# Patient Record
Sex: Female | Born: 1955 | Race: Black or African American | Hispanic: No | Marital: Single | Smoking: Never smoker
Health system: Southern US, Community
[De-identification: ages and names within clinical notes are randomized; demographics above are authoritative.]

## PROBLEM LIST (undated history)

## (undated) DIAGNOSIS — G43909 Migraine, unspecified, not intractable, without status migrainosus: Secondary | ICD-10-CM

## (undated) DIAGNOSIS — N19 Unspecified kidney failure: Secondary | ICD-10-CM

## (undated) DIAGNOSIS — E785 Hyperlipidemia, unspecified: Secondary | ICD-10-CM

## (undated) DIAGNOSIS — Z5189 Encounter for other specified aftercare: Secondary | ICD-10-CM

## (undated) DIAGNOSIS — I499 Cardiac arrhythmia, unspecified: Secondary | ICD-10-CM

## (undated) DIAGNOSIS — T8859XA Other complications of anesthesia, initial encounter: Secondary | ICD-10-CM

## (undated) DIAGNOSIS — E041 Nontoxic single thyroid nodule: Secondary | ICD-10-CM

## (undated) DIAGNOSIS — D649 Anemia, unspecified: Secondary | ICD-10-CM

## (undated) DIAGNOSIS — I10 Essential (primary) hypertension: Secondary | ICD-10-CM

## (undated) DIAGNOSIS — R112 Nausea with vomiting, unspecified: Secondary | ICD-10-CM

## (undated) DIAGNOSIS — M949 Disorder of cartilage, unspecified: Secondary | ICD-10-CM

## (undated) DIAGNOSIS — Z9889 Other specified postprocedural states: Secondary | ICD-10-CM

## (undated) DIAGNOSIS — M810 Age-related osteoporosis without current pathological fracture: Secondary | ICD-10-CM

## (undated) DIAGNOSIS — K219 Gastro-esophageal reflux disease without esophagitis: Secondary | ICD-10-CM

## (undated) DIAGNOSIS — T4145XA Adverse effect of unspecified anesthetic, initial encounter: Secondary | ICD-10-CM

## (undated) DIAGNOSIS — M899 Disorder of bone, unspecified: Secondary | ICD-10-CM

## (undated) DIAGNOSIS — Z8719 Personal history of other diseases of the digestive system: Secondary | ICD-10-CM

## (undated) DIAGNOSIS — T7840XA Allergy, unspecified, initial encounter: Secondary | ICD-10-CM

## (undated) DIAGNOSIS — M1990 Unspecified osteoarthritis, unspecified site: Secondary | ICD-10-CM

## (undated) DIAGNOSIS — R011 Cardiac murmur, unspecified: Secondary | ICD-10-CM

## (undated) HISTORY — DX: Encounter for other specified aftercare: Z51.89

## (undated) HISTORY — PX: APPENDECTOMY: SHX54

## (undated) HISTORY — PX: LAPAROSCOPIC ENDOMETRIOSIS FULGURATION: SUR769

## (undated) HISTORY — DX: Disorder of cartilage, unspecified: M94.9

## (undated) HISTORY — DX: Age-related osteoporosis without current pathological fracture: M81.0

## (undated) HISTORY — PX: FRACTURE SURGERY: SHX138

## (undated) HISTORY — DX: Disorder of bone, unspecified: M89.9

## (undated) HISTORY — DX: Anemia, unspecified: D64.9

## (undated) HISTORY — DX: Unspecified osteoarthritis, unspecified site: M19.90

## (undated) HISTORY — DX: Cardiac murmur, unspecified: R01.1

## (undated) HISTORY — PX: FINGER FRACTURE SURGERY: SHX638

## (undated) HISTORY — PX: COLONOSCOPY: SHX174

## (undated) HISTORY — DX: Allergy, unspecified, initial encounter: T78.40XA

## (undated) HISTORY — DX: Unspecified kidney failure: N19

## (undated) HISTORY — DX: Hyperlipidemia, unspecified: E78.5

## (undated) HISTORY — DX: Essential (primary) hypertension: I10

## (undated) HISTORY — PX: NOSE SURGERY: SHX723

## (undated) HISTORY — DX: Migraine, unspecified, not intractable, without status migrainosus: G43.909

---

## 1997-11-06 ENCOUNTER — Ambulatory Visit (HOSPITAL_COMMUNITY): Admission: RE | Admit: 1997-11-06 | Discharge: 1997-11-06 | Payer: Self-pay | Admitting: Family Medicine

## 1998-05-30 ENCOUNTER — Emergency Department (HOSPITAL_COMMUNITY): Admission: EM | Admit: 1998-05-30 | Discharge: 1998-05-30 | Payer: Self-pay | Admitting: Emergency Medicine

## 1998-05-31 ENCOUNTER — Encounter: Payer: Self-pay | Admitting: Emergency Medicine

## 1998-07-23 ENCOUNTER — Other Ambulatory Visit: Admission: RE | Admit: 1998-07-23 | Discharge: 1998-07-23 | Payer: Self-pay | Admitting: Family Medicine

## 1998-09-16 ENCOUNTER — Emergency Department (HOSPITAL_COMMUNITY): Admission: EM | Admit: 1998-09-16 | Discharge: 1998-09-16 | Payer: Self-pay | Admitting: Emergency Medicine

## 1998-10-17 ENCOUNTER — Encounter: Admission: RE | Admit: 1998-10-17 | Discharge: 1999-01-15 | Payer: Self-pay | Admitting: Orthopedic Surgery

## 1999-10-01 ENCOUNTER — Other Ambulatory Visit: Admission: RE | Admit: 1999-10-01 | Discharge: 1999-10-01 | Payer: Self-pay | Admitting: Family Medicine

## 2000-07-13 ENCOUNTER — Encounter: Payer: Self-pay | Admitting: Family Medicine

## 2000-07-13 ENCOUNTER — Ambulatory Visit (HOSPITAL_COMMUNITY): Admission: RE | Admit: 2000-07-13 | Discharge: 2000-07-13 | Payer: Self-pay | Admitting: Family Medicine

## 2001-07-20 ENCOUNTER — Encounter: Payer: Self-pay | Admitting: Internal Medicine

## 2001-07-20 ENCOUNTER — Ambulatory Visit (HOSPITAL_COMMUNITY): Admission: RE | Admit: 2001-07-20 | Discharge: 2001-07-20 | Payer: Self-pay | Admitting: Internal Medicine

## 2001-11-02 ENCOUNTER — Other Ambulatory Visit: Admission: RE | Admit: 2001-11-02 | Discharge: 2001-11-02 | Payer: Self-pay | Admitting: Obstetrics and Gynecology

## 2002-04-07 ENCOUNTER — Ambulatory Visit (HOSPITAL_BASED_OUTPATIENT_CLINIC_OR_DEPARTMENT_OTHER): Admission: RE | Admit: 2002-04-07 | Discharge: 2002-04-07 | Payer: Self-pay | Admitting: Orthopedic Surgery

## 2003-03-08 ENCOUNTER — Ambulatory Visit (HOSPITAL_COMMUNITY): Admission: RE | Admit: 2003-03-08 | Discharge: 2003-03-08 | Payer: Self-pay | Admitting: Family Medicine

## 2003-03-08 ENCOUNTER — Encounter: Admission: RE | Admit: 2003-03-08 | Discharge: 2003-03-08 | Payer: Self-pay | Admitting: Family Medicine

## 2003-04-25 ENCOUNTER — Encounter: Admission: RE | Admit: 2003-04-25 | Discharge: 2003-04-25 | Payer: Self-pay | Admitting: Sports Medicine

## 2003-05-12 ENCOUNTER — Ambulatory Visit (HOSPITAL_COMMUNITY): Admission: RE | Admit: 2003-05-12 | Discharge: 2003-05-12 | Payer: Self-pay | Admitting: Vascular Surgery

## 2003-05-29 ENCOUNTER — Encounter: Admission: RE | Admit: 2003-05-29 | Discharge: 2003-05-29 | Payer: Self-pay | Admitting: Sports Medicine

## 2003-06-05 ENCOUNTER — Encounter: Admission: RE | Admit: 2003-06-05 | Discharge: 2003-06-05 | Payer: Self-pay | Admitting: Family Medicine

## 2003-10-13 ENCOUNTER — Encounter: Admission: RE | Admit: 2003-10-13 | Discharge: 2003-10-13 | Payer: Self-pay | Admitting: Family Medicine

## 2003-10-27 ENCOUNTER — Encounter: Admission: RE | Admit: 2003-10-27 | Discharge: 2003-10-27 | Payer: Self-pay | Admitting: Family Medicine

## 2003-10-27 ENCOUNTER — Encounter (INDEPENDENT_AMBULATORY_CARE_PROVIDER_SITE_OTHER): Payer: Self-pay | Admitting: Specialist

## 2003-11-02 ENCOUNTER — Ambulatory Visit (HOSPITAL_COMMUNITY): Admission: RE | Admit: 2003-11-02 | Discharge: 2003-11-02 | Payer: Self-pay | Admitting: *Deleted

## 2003-11-06 ENCOUNTER — Ambulatory Visit (HOSPITAL_COMMUNITY): Admission: RE | Admit: 2003-11-06 | Discharge: 2003-11-06 | Payer: Self-pay | Admitting: Internal Medicine

## 2003-12-01 ENCOUNTER — Encounter: Admission: RE | Admit: 2003-12-01 | Discharge: 2003-12-01 | Payer: Self-pay | Admitting: Family Medicine

## 2003-12-04 ENCOUNTER — Ambulatory Visit (HOSPITAL_COMMUNITY): Admission: RE | Admit: 2003-12-04 | Discharge: 2003-12-04 | Payer: Self-pay | Admitting: Sports Medicine

## 2003-12-11 ENCOUNTER — Encounter: Admission: RE | Admit: 2003-12-11 | Discharge: 2003-12-11 | Payer: Self-pay | Admitting: Sports Medicine

## 2004-02-12 ENCOUNTER — Ambulatory Visit: Payer: Self-pay | Admitting: Family Medicine

## 2004-02-14 ENCOUNTER — Ambulatory Visit (HOSPITAL_COMMUNITY): Admission: RE | Admit: 2004-02-14 | Discharge: 2004-02-14 | Payer: Self-pay | Admitting: Family Medicine

## 2004-03-29 ENCOUNTER — Ambulatory Visit: Payer: Self-pay | Admitting: Sports Medicine

## 2004-05-03 ENCOUNTER — Ambulatory Visit: Payer: Self-pay | Admitting: Sports Medicine

## 2004-11-01 ENCOUNTER — Ambulatory Visit: Payer: Self-pay | Admitting: Family Medicine

## 2005-03-21 ENCOUNTER — Encounter (INDEPENDENT_AMBULATORY_CARE_PROVIDER_SITE_OTHER): Payer: Self-pay | Admitting: *Deleted

## 2005-03-21 LAB — CONVERTED CEMR LAB

## 2005-03-25 ENCOUNTER — Ambulatory Visit: Payer: Self-pay | Admitting: Sports Medicine

## 2005-03-26 ENCOUNTER — Ambulatory Visit: Payer: Self-pay | Admitting: Family Medicine

## 2005-04-03 ENCOUNTER — Ambulatory Visit: Payer: Self-pay | Admitting: Family Medicine

## 2005-04-16 ENCOUNTER — Ambulatory Visit (HOSPITAL_COMMUNITY): Admission: RE | Admit: 2005-04-16 | Discharge: 2005-04-16 | Payer: Self-pay | Admitting: Family Medicine

## 2005-04-23 ENCOUNTER — Emergency Department (HOSPITAL_COMMUNITY): Admission: EM | Admit: 2005-04-23 | Discharge: 2005-04-23 | Payer: Self-pay | Admitting: Emergency Medicine

## 2005-04-23 ENCOUNTER — Ambulatory Visit (HOSPITAL_COMMUNITY): Admission: RE | Admit: 2005-04-23 | Discharge: 2005-04-23 | Payer: Self-pay | Admitting: Family Medicine

## 2005-05-14 ENCOUNTER — Ambulatory Visit: Payer: Self-pay | Admitting: Family Medicine

## 2005-05-21 ENCOUNTER — Emergency Department (HOSPITAL_COMMUNITY): Admission: EM | Admit: 2005-05-21 | Discharge: 2005-05-21 | Payer: Self-pay | Admitting: Family Medicine

## 2005-07-23 ENCOUNTER — Ambulatory Visit: Payer: Self-pay | Admitting: Family Medicine

## 2005-07-25 ENCOUNTER — Ambulatory Visit: Payer: Self-pay | Admitting: Family Medicine

## 2005-09-03 ENCOUNTER — Encounter: Admission: RE | Admit: 2005-09-03 | Discharge: 2005-09-03 | Payer: Self-pay | Admitting: Nephrology

## 2005-09-25 ENCOUNTER — Ambulatory Visit (HOSPITAL_COMMUNITY): Admission: RE | Admit: 2005-09-25 | Discharge: 2005-09-25 | Payer: Self-pay | Admitting: Urology

## 2005-10-25 ENCOUNTER — Emergency Department (HOSPITAL_COMMUNITY): Admission: EM | Admit: 2005-10-25 | Discharge: 2005-10-26 | Payer: Self-pay | Admitting: Emergency Medicine

## 2005-11-28 ENCOUNTER — Ambulatory Visit: Payer: Self-pay | Admitting: Family Medicine

## 2006-01-01 ENCOUNTER — Ambulatory Visit: Payer: Self-pay | Admitting: Family Medicine

## 2006-01-02 ENCOUNTER — Ambulatory Visit (HOSPITAL_COMMUNITY): Admission: RE | Admit: 2006-01-02 | Discharge: 2006-01-02 | Payer: Self-pay | Admitting: Family Medicine

## 2006-01-08 ENCOUNTER — Ambulatory Visit: Payer: Self-pay | Admitting: Family Medicine

## 2006-06-18 DIAGNOSIS — M899 Disorder of bone, unspecified: Secondary | ICD-10-CM | POA: Insufficient documentation

## 2006-06-18 DIAGNOSIS — G43909 Migraine, unspecified, not intractable, without status migrainosus: Secondary | ICD-10-CM

## 2006-06-18 DIAGNOSIS — M810 Age-related osteoporosis without current pathological fracture: Secondary | ICD-10-CM | POA: Insufficient documentation

## 2006-06-18 DIAGNOSIS — E7800 Pure hypercholesterolemia, unspecified: Secondary | ICD-10-CM

## 2006-06-18 DIAGNOSIS — K219 Gastro-esophageal reflux disease without esophagitis: Secondary | ICD-10-CM | POA: Insufficient documentation

## 2006-06-18 DIAGNOSIS — N19 Unspecified kidney failure: Secondary | ICD-10-CM

## 2006-06-18 DIAGNOSIS — M949 Disorder of cartilage, unspecified: Secondary | ICD-10-CM

## 2006-06-18 HISTORY — DX: Unspecified kidney failure: N19

## 2006-06-18 HISTORY — DX: Disorder of bone, unspecified: M89.9

## 2006-06-18 HISTORY — DX: Migraine, unspecified, not intractable, without status migrainosus: G43.909

## 2006-06-19 ENCOUNTER — Encounter (INDEPENDENT_AMBULATORY_CARE_PROVIDER_SITE_OTHER): Payer: Self-pay | Admitting: *Deleted

## 2006-06-24 ENCOUNTER — Ambulatory Visit (HOSPITAL_COMMUNITY): Admission: RE | Admit: 2006-06-24 | Discharge: 2006-06-24 | Payer: Self-pay | Admitting: Obstetrics and Gynecology

## 2006-07-21 ENCOUNTER — Telehealth: Payer: Self-pay | Admitting: *Deleted

## 2006-07-22 ENCOUNTER — Ambulatory Visit: Payer: Self-pay | Admitting: Family Medicine

## 2006-09-17 ENCOUNTER — Telehealth: Payer: Self-pay | Admitting: *Deleted

## 2006-09-18 ENCOUNTER — Ambulatory Visit: Payer: Self-pay | Admitting: Family Medicine

## 2006-10-15 ENCOUNTER — Telehealth: Payer: Self-pay | Admitting: *Deleted

## 2006-10-20 ENCOUNTER — Encounter: Payer: Self-pay | Admitting: *Deleted

## 2006-10-21 ENCOUNTER — Encounter (INDEPENDENT_AMBULATORY_CARE_PROVIDER_SITE_OTHER): Payer: Self-pay | Admitting: Family Medicine

## 2006-10-21 ENCOUNTER — Ambulatory Visit: Payer: Self-pay | Admitting: Sports Medicine

## 2006-10-21 LAB — CONVERTED CEMR LAB
ALT: 9 units/L (ref 0–35)
AST: 14 units/L (ref 0–37)
Albumin: 4.2 g/dL (ref 3.5–5.2)
Alkaline Phosphatase: 69 units/L (ref 39–117)
BUN: 17 mg/dL (ref 6–23)
CO2: 26 meq/L (ref 19–32)
Calcium: 9.7 mg/dL (ref 8.4–10.5)
Chloride: 102 meq/L (ref 96–112)
Creatinine, Ser: 1.29 mg/dL — ABNORMAL HIGH (ref 0.40–1.20)
Direct LDL: 124 mg/dL — ABNORMAL HIGH
Glucose, Bld: 71 mg/dL (ref 70–99)
Potassium: 4 meq/L (ref 3.5–5.3)
Sodium: 140 meq/L (ref 135–145)
Total Bilirubin: 0.4 mg/dL (ref 0.3–1.2)
Total Protein: 7.4 g/dL (ref 6.0–8.3)

## 2006-10-22 ENCOUNTER — Ambulatory Visit (HOSPITAL_COMMUNITY): Admission: RE | Admit: 2006-10-22 | Discharge: 2006-10-22 | Payer: Self-pay | Admitting: Family Medicine

## 2006-11-04 ENCOUNTER — Telehealth (INDEPENDENT_AMBULATORY_CARE_PROVIDER_SITE_OTHER): Payer: Self-pay | Admitting: *Deleted

## 2006-12-14 ENCOUNTER — Telehealth (INDEPENDENT_AMBULATORY_CARE_PROVIDER_SITE_OTHER): Payer: Self-pay | Admitting: *Deleted

## 2006-12-14 ENCOUNTER — Encounter: Payer: Self-pay | Admitting: *Deleted

## 2006-12-15 ENCOUNTER — Ambulatory Visit: Payer: Self-pay | Admitting: Family Medicine

## 2006-12-15 ENCOUNTER — Telehealth: Payer: Self-pay | Admitting: *Deleted

## 2006-12-15 ENCOUNTER — Encounter (INDEPENDENT_AMBULATORY_CARE_PROVIDER_SITE_OTHER): Payer: Self-pay | Admitting: Family Medicine

## 2006-12-15 ENCOUNTER — Ambulatory Visit (HOSPITAL_COMMUNITY): Admission: RE | Admit: 2006-12-15 | Discharge: 2006-12-15 | Payer: Self-pay | Admitting: Family Medicine

## 2006-12-15 LAB — CONVERTED CEMR LAB
Bilirubin Urine: NEGATIVE
Glucose, Urine, Semiquant: NEGATIVE
Ketones, urine, test strip: NEGATIVE
Nitrite: NEGATIVE
Protein, U semiquant: 100
Specific Gravity, Urine: 1.01
Urobilinogen, UA: 0.2
WBC Urine, dipstick: NEGATIVE
pH: 5.5

## 2006-12-16 LAB — CONVERTED CEMR LAB
BUN: 23 mg/dL (ref 6–23)
CO2: 24 meq/L (ref 19–32)
Calcium: 10 mg/dL (ref 8.4–10.5)
Chloride: 103 meq/L (ref 96–112)
Creatinine, Ser: 1.27 mg/dL — ABNORMAL HIGH (ref 0.40–1.20)
Glucose, Bld: 87 mg/dL (ref 70–99)
HCT: 41.5 % (ref 36.0–46.0)
Hemoglobin: 13.4 g/dL (ref 12.0–15.0)
MCHC: 32.3 g/dL (ref 30.0–36.0)
MCV: 84.9 fL (ref 78.0–100.0)
Platelets: 386 10*3/uL (ref 150–400)
Potassium: 4.5 meq/L (ref 3.5–5.3)
RBC: 4.89 M/uL (ref 3.87–5.11)
RDW: 13.2 % (ref 11.5–14.0)
Sodium: 140 meq/L (ref 135–145)
TSH: 1.004 microintl units/mL (ref 0.350–5.50)
WBC: 7.5 10*3/uL (ref 4.0–10.5)

## 2006-12-17 ENCOUNTER — Encounter (INDEPENDENT_AMBULATORY_CARE_PROVIDER_SITE_OTHER): Payer: Self-pay | Admitting: Family Medicine

## 2007-01-15 ENCOUNTER — Encounter (INDEPENDENT_AMBULATORY_CARE_PROVIDER_SITE_OTHER): Payer: Self-pay | Admitting: *Deleted

## 2007-07-16 ENCOUNTER — Encounter (INDEPENDENT_AMBULATORY_CARE_PROVIDER_SITE_OTHER): Payer: Self-pay | Admitting: Family Medicine

## 2007-07-16 ENCOUNTER — Ambulatory Visit: Payer: Self-pay | Admitting: Family Medicine

## 2007-07-16 ENCOUNTER — Ambulatory Visit (HOSPITAL_COMMUNITY): Admission: RE | Admit: 2007-07-16 | Discharge: 2007-07-16 | Payer: Self-pay | Admitting: Family Medicine

## 2007-07-16 DIAGNOSIS — R002 Palpitations: Secondary | ICD-10-CM

## 2007-07-16 HISTORY — DX: Palpitations: R00.2

## 2007-07-16 LAB — CONVERTED CEMR LAB
Cholesterol, target level: 200 mg/dL
H Pylori IgG: NEGATIVE
HDL goal, serum: 40 mg/dL
LDL Goal: 160 mg/dL

## 2007-07-19 ENCOUNTER — Encounter (INDEPENDENT_AMBULATORY_CARE_PROVIDER_SITE_OTHER): Payer: Self-pay | Admitting: Family Medicine

## 2007-07-21 ENCOUNTER — Telehealth (INDEPENDENT_AMBULATORY_CARE_PROVIDER_SITE_OTHER): Payer: Self-pay | Admitting: Family Medicine

## 2007-07-22 ENCOUNTER — Encounter (INDEPENDENT_AMBULATORY_CARE_PROVIDER_SITE_OTHER): Payer: Self-pay | Admitting: Family Medicine

## 2007-07-27 ENCOUNTER — Ambulatory Visit (HOSPITAL_COMMUNITY): Admission: RE | Admit: 2007-07-27 | Discharge: 2007-07-27 | Payer: Self-pay | Admitting: Family Medicine

## 2007-07-28 ENCOUNTER — Ambulatory Visit: Payer: Self-pay | Admitting: Family Medicine

## 2007-07-29 ENCOUNTER — Encounter (INDEPENDENT_AMBULATORY_CARE_PROVIDER_SITE_OTHER): Payer: Self-pay | Admitting: Family Medicine

## 2007-11-22 ENCOUNTER — Encounter: Payer: Self-pay | Admitting: Family Medicine

## 2007-11-22 ENCOUNTER — Ambulatory Visit: Payer: Self-pay | Admitting: Sports Medicine

## 2007-11-24 ENCOUNTER — Encounter: Payer: Self-pay | Admitting: Family Medicine

## 2007-11-24 LAB — CONVERTED CEMR LAB: Pap Smear: NORMAL

## 2007-11-26 ENCOUNTER — Ambulatory Visit: Payer: Self-pay | Admitting: Sports Medicine

## 2008-03-29 ENCOUNTER — Telehealth: Payer: Self-pay | Admitting: *Deleted

## 2008-03-30 ENCOUNTER — Ambulatory Visit: Payer: Self-pay | Admitting: Family Medicine

## 2008-03-30 ENCOUNTER — Encounter: Payer: Self-pay | Admitting: Family Medicine

## 2008-04-19 LAB — CONVERTED CEMR LAB
ALT: 17 units/L (ref 0–35)
AST: 17 units/L (ref 0–37)
Albumin: 4.4 g/dL (ref 3.5–5.2)
Alkaline Phosphatase: 60 units/L (ref 39–117)
BUN: 24 mg/dL — ABNORMAL HIGH (ref 6–23)
CO2: 23 meq/L (ref 19–32)
Calcium: 9.9 mg/dL (ref 8.4–10.5)
Chloride: 105 meq/L (ref 96–112)
Creatinine, Ser: 1.42 mg/dL — ABNORMAL HIGH (ref 0.40–1.20)
Glucose, Bld: 91 mg/dL (ref 70–99)
HCT: 39.7 % (ref 36.0–46.0)
Hemoglobin: 12.8 g/dL (ref 12.0–15.0)
MCHC: 32.2 g/dL (ref 30.0–36.0)
MCV: 85.4 fL (ref 78.0–100.0)
Platelets: 383 10*3/uL (ref 150–400)
Potassium: 4.8 meq/L (ref 3.5–5.3)
RBC: 4.65 M/uL (ref 3.87–5.11)
RDW: 12.5 % (ref 11.5–15.5)
Sodium: 140 meq/L (ref 135–145)
TSH: 0.928 microintl units/mL (ref 0.350–4.50)
Total Bilirubin: 0.6 mg/dL (ref 0.3–1.2)
Total Protein: 7.4 g/dL (ref 6.0–8.3)
WBC: 6.9 10*3/uL (ref 4.0–10.5)

## 2008-09-26 ENCOUNTER — Ambulatory Visit (HOSPITAL_COMMUNITY): Admission: RE | Admit: 2008-09-26 | Discharge: 2008-09-26 | Payer: Self-pay | Admitting: Family Medicine

## 2008-09-26 ENCOUNTER — Encounter: Payer: Self-pay | Admitting: Family Medicine

## 2009-03-28 ENCOUNTER — Encounter: Payer: Self-pay | Admitting: Family Medicine

## 2009-12-05 ENCOUNTER — Ambulatory Visit: Payer: Self-pay | Admitting: Family Medicine

## 2009-12-05 ENCOUNTER — Encounter: Payer: Self-pay | Admitting: Family Medicine

## 2009-12-05 DIAGNOSIS — G8929 Other chronic pain: Secondary | ICD-10-CM | POA: Insufficient documentation

## 2009-12-05 DIAGNOSIS — IMO0002 Reserved for concepts with insufficient information to code with codable children: Secondary | ICD-10-CM | POA: Insufficient documentation

## 2009-12-05 DIAGNOSIS — R252 Cramp and spasm: Secondary | ICD-10-CM

## 2009-12-05 HISTORY — DX: Other chronic pain: G89.29

## 2009-12-06 ENCOUNTER — Encounter: Payer: Self-pay | Admitting: Family Medicine

## 2009-12-06 LAB — CONVERTED CEMR LAB
BUN: 27 mg/dL — ABNORMAL HIGH (ref 6–23)
CO2: 23 meq/L (ref 19–32)
Calcium: 10.1 mg/dL (ref 8.4–10.5)
Chloride: 104 meq/L (ref 96–112)
Creatinine, Ser: 1.51 mg/dL — ABNORMAL HIGH (ref 0.40–1.20)
Glucose, Bld: 92 mg/dL (ref 70–99)
HCT: 39.3 % (ref 36.0–46.0)
Hemoglobin: 13.1 g/dL (ref 12.0–15.0)
MCHC: 33.3 g/dL (ref 30.0–36.0)
MCV: 86.4 fL (ref 78.0–100.0)
Platelets: 366 10*3/uL (ref 150–400)
Potassium: 4.6 meq/L (ref 3.5–5.3)
RBC: 4.55 M/uL (ref 3.87–5.11)
RDW: 13 % (ref 11.5–15.5)
Sodium: 137 meq/L (ref 135–145)
TSH: 1.032 microintl units/mL (ref 0.350–4.500)
Total CK: 125 units/L (ref 7–177)
Vit D, 25-Hydroxy: 38 ng/mL (ref 30–89)
WBC: 7 10*3/uL (ref 4.0–10.5)

## 2010-01-03 ENCOUNTER — Encounter
Admission: RE | Admit: 2010-01-03 | Discharge: 2010-01-30 | Payer: Self-pay | Source: Home / Self Care | Admitting: Family Medicine

## 2010-01-24 ENCOUNTER — Ambulatory Visit (HOSPITAL_COMMUNITY): Admission: RE | Admit: 2010-01-24 | Discharge: 2010-01-24 | Payer: Self-pay | Admitting: Family Medicine

## 2010-01-30 ENCOUNTER — Encounter: Payer: Self-pay | Admitting: Family Medicine

## 2010-04-11 ENCOUNTER — Encounter: Payer: Self-pay | Admitting: Family Medicine

## 2010-04-25 ENCOUNTER — Encounter: Payer: Self-pay | Admitting: Family Medicine

## 2010-04-25 ENCOUNTER — Ambulatory Visit
Admission: RE | Admit: 2010-04-25 | Discharge: 2010-04-25 | Payer: Self-pay | Source: Home / Self Care | Attending: Family Medicine | Admitting: Family Medicine

## 2010-04-25 DIAGNOSIS — M542 Cervicalgia: Secondary | ICD-10-CM | POA: Insufficient documentation

## 2010-04-25 LAB — CONVERTED CEMR LAB
BUN: 21 mg/dL (ref 6–23)
CO2: 26 meq/L (ref 19–32)
Chloride: 102 meq/L (ref 96–112)
Potassium: 4.8 meq/L (ref 3.5–5.3)

## 2010-04-29 ENCOUNTER — Telehealth: Payer: Self-pay | Admitting: Family Medicine

## 2010-05-19 LAB — CONVERTED CEMR LAB
ALT: 9 units/L (ref 0–35)
AST: 17 units/L (ref 0–37)
Albumin: 4.1 g/dL (ref 3.5–5.2)
Alkaline Phosphatase: 67 units/L (ref 39–117)
BUN: 21 mg/dL (ref 6–23)
CO2: 23 meq/L (ref 19–32)
Calcium: 9.4 mg/dL (ref 8.4–10.5)
Chloride: 104 meq/L (ref 96–112)
Cholesterol: 193 mg/dL (ref 0–200)
Creatinine, Ser: 1.34 mg/dL — ABNORMAL HIGH (ref 0.40–1.20)
Glucose, Bld: 86 mg/dL (ref 70–99)
HCT: 38.9 % (ref 36.0–46.0)
HDL: 52 mg/dL (ref >39)
Hemoglobin: 12.3 g/dL (ref 12.0–15.0)
LDL Cholesterol: 117 mg/dL — ABNORMAL HIGH (ref 0–99)
MCHC: 31.6 g/dL (ref 30.0–36.0)
MCV: 85.7 fL (ref 78.0–100.0)
Platelets: 346 10*3/uL (ref 150–400)
Potassium: 4.5 meq/L (ref 3.5–5.3)
RBC: 4.54 M/uL (ref 3.87–5.11)
RDW: 12.9 % (ref 11.5–15.5)
Sodium: 140 meq/L (ref 135–145)
Total Bilirubin: 0.4 mg/dL (ref 0.3–1.2)
Total CHOL/HDL Ratio: 3.7
Total Protein: 7.1 g/dL (ref 6.0–8.3)
Triglycerides: 119 mg/dL (ref <150)
VLDL: 24 mg/dL (ref 0–40)
WBC: 5.7 10*3/uL (ref 4.0–10.5)

## 2010-05-23 NOTE — Letter (Signed)
Summary: Generic Letter  [facility]  [address]   House, [place] [address]   Phone: [phone]  Fax: [phone]    12/06/2009  Shamar Goebel [address], [place]  [address]  Dear Ms. Hunton,  I am happy to inform you that your labs were all normal with the exception of an elevated creatinine (kidney function). We are requesting records from your kidney doctor. We can discuss this at your next visit. Let us know if you have any questions or concerns.  Sincerely,   Briscoe Deutscher DO  Appended Document: Generic Letter mailed.

## 2010-05-23 NOTE — Assessment & Plan Note (Signed)
Summary: throat pain on rt side rad to lf ear,and eye/[facility]c   Vital Signs:  Patient profile:   55 year old female Height:      66.5 inches Weight:      166 pounds BMI:     26.49 Pulse rate:   84 / minute BP sitting:   126 / 84  (right arm) Cuff size:   regular  Vitals Entered By: Schuyler Amor CMA (April 25, 2010 10:24 AM) CC: right sided neck pain Is Patient Diabetic? No Pain Assessment Patient in pain? yes        Primary Care Provider:  . [facility]  CC:  right sided neck pain.  History of Present Illness: [name] has had 2 months right sidded throat pain that radiates to her ear and head. It is tender along her anterior and lateral neck to palpation. She denies any trouble swallowing or horseness or feeling bad. She does have GERD symptoms that she treates with baking soda.   Habits & Providers  Alcohol-Tobacco-Diet     Tobacco Status: never     Passive Smoke Exposure: no  Current Problems (verified): 1)  Neck Pain, Right  (ICD-723.1) 2)  Back Pain With Radiculopathy  (ICD-729.2) 3)  Calf Cramps, Nocturnal  (ICD-729.82) 4)  Palpitations, Recurrent  (ICD-785.1) 5)  Renal Insufficiency, Chronic  (ICD-586) 6)  Osteopenia  (ICD-733.90) 7)  Migraine, Unspec., w/o Intractable Migraine  (ICD-346.90) 8)  Hypercholesterolemia  (ICD-272.0) 9)  Gastroesophageal Reflux, No Esophagitis  (ICD-530.81)  Current Medications (verified): 1)  Sm Calcium/vitamin D 500-200 Mg-Unit Tabs (Calcium Carbonate-Vitamin D) .... Take 1 Tablet By Mouth Three Times A Day 2)  Requip 1 Mg Tabs (Ropinirole Hydrochloride) .Marland Kitchen.. 1 At [facility]  Allergies (verified): 1)  Sulfamethoxazole (Sulfamethoxazole)  Past History:  Past Medical History: Last updated: 03/30/2008 broken nose -- repaired endometrial sugery at [facility] ESR 05/26/03 - 14 hx left hand fx -- residual tendon & pain problems Left oopherectomy RDS left hand vaginal delivery x1  Past Surgical History: Last updated:  03/30/2008 CT - Head (NAD) - 05/26/2003 ETT - low risk - 05/26/2003  Family History: Last updated: 03/30/2008 Lung cancer in father & `thru out` family Mother -- atrial fib, dm Sister -- heart prob starting at age 54 (large heart) Strong CAD hs (Died d/t MI at young ages:;uncles at 24 & 47; GF at 40; aunt at 19  Social History: Last updated: 03/30/2008 Lives alone.   One son.   Denies etoh, drug or tob use.   Works for [employer].   Does not have health insurance.  Risk Factors: Smoking Status: never (04/25/2010) Passive Smoke Exposure: no (04/25/2010)  Review of Systems  The patient denies anorexia, fever, weight loss, syncope, dyspnea on exertion, abdominal pain, melena, severe indigestion/heartburn, muscle weakness, difficulty walking, and enlarged lymph nodes.    Physical Exam  General:  Vs noted.  Well woman in nad Eyes:  No corneal or conjunctival inflammation noted. EOMI. Perrla.. Vision grossly normal. Ears:  no external deformities.   Nose:  no external erythema.   Mouth:  Oral mucosa and oropharynx without lesions or exudates.  Teeth in good repair. Neck:  Mildly tender over right thyroid however no masses or nodules noted. See ultrasound.  Lungs:  Normal respiratory effort, chest expands symmetrically. Lungs are clear to auscultation, no crackles or wheezes. Heart:  Normal rate and regular rhythm. S1 and S2 normal without gallop, murmur, click, rub or other extra sounds. Abdomen:  Non distended,  NABS, soft, nontender, no guarding or rebound.   Cervical Nodes:  No lymphadenopathy noted Axillary Nodes:  No palpable lymphadenopathy Additional Exam:  Neck Ultrasound: Right side of neck in tender area visulized with an ultrasound. Using colow flow mode I isolated a small pocket of hypoechoic area in the region of the thyroid. Not compressable.    Impression & Recommendations:  Problem # 1:  NECK PAIN, RIGHT (ICD-723.1) Assessment New Based on history  and my exam including fast ultrasound I suspect thryroid as an etiology.  Alternative DDX includes thyroglossal duct cysts or MSK etiology.  Plan conservative apprach is to obtain a TSH today and send pt for a diagnostic ultrasound of her neck to confirm my tenative findings of a nodule.  Will follow up in clinic in a few weeks to review findings.    The following medications were removed from the medication list:    Meloxicam 15 Mg Tabs (Meloxicam) ..... One by mouth daily  Orders: TSH-[facility] [id number]) Ultrasound (Ultrasound) [facility]- Est  Level 4 [id number])  Problem # 2:  RENAL INSUFFICIENCY, CHRONIC (ICD-586) Assessment: Unchanged This is a chronic problem that has not been evaluated for some time.  Will obtain a BMP today with the TSH to follow this issue up.  Orders: Basic Met-[facility] [id number])  Complete Medication List: 1)  Sm Calcium/vitamin D 500-200 Mg-unit Tabs (Calcium carbonate-vitamin d) .... Take 1 tablet by mouth three times a day 2)  Requip 1 Mg Tabs (Ropinirole hydrochloride) .Marland Kitchen.. 1 at hs  Patient Instructions: 1)  Thank you for seeing me today. 2)  We will set you up with an appointment to get a neck ultrasound.  3)  I will call you with results. I will leave a message.  4)  Please start taking pepicd twice a day or zantac twice a day.  5)  If you have chest pain, difficulty breathing, fevers over 102 that does not get better with tylenol please call us or see a doctor.  6)  Trouble swallowing let know.  7)  Please schedule a follow-up appointment in 1 month.    Orders Added: 1)  Basic Met[facility] [id number] 2)  TSH[facility] [id number] 3)  Ultrasound [Ultrasound] 4)  [facility]- Est  Level 4 [id number]

## 2010-05-23 NOTE — Miscellaneous (Signed)
Summary: [facility]  [facility]   Imported By: Tobin Chad 03/21/2010 10:05:12  _____________________________________________________________________  External Attachment:    Type:   Image     Comment:   External Document

## 2010-05-23 NOTE — Progress Notes (Signed)
  Phone Note Outgoing Call   Call placed by: Lynne Leader MD,  April 29, 2010 8:10 AM Summary of Call: Called and left a generic message about test results. She will call with questions.  Initial call taken by: Lynne Leader MD,  April 29, 2010 8:11 AM

## 2010-05-23 NOTE — Assessment & Plan Note (Signed)
Summary: pain in legs,tcb   Vital Signs:  Patient profile:   55 year old female Height:      66.5 inches Weight:      163.9 pounds BMI:     26.15 Temp:     97.9 degrees F oral Pulse rate:   84 / minute BP sitting:   118 / 80  (left arm)  Vitals Entered By: Jarrett Ables CMA (December 05, 2009 10:00 AM) CC: back and leg pain Is Patient Diabetic? No Pain Assessment Patient in pain? yes     Location: back Intensity: 8 Type: aching  Does patient need assistance? Functional Status Self care Ambulation Normal   Primary Care Provider:  . [facility]  CC:  back and leg pain.  History of Present Illness: 55 yo F:  1. Back Pain: low back, right side, with intermittent radiation down right leg. hx of back injury (L4/L5 HNP) in 2001 2/2 lifting a heavy object. no bowel/bladder incontinence, no long term use of steroids, no famhx of OP. previous back injections helped.  2. Leg Cramps: calf spasms, usually at night, wakes her from sleep, never evaluated in past, moving legs helps.  Habits & Providers  Alcohol-Tobacco-Diet     Tobacco Status: never     Passive Smoke Exposure: no  Allergies: 1)  Sulfamethoxazole (Sulfamethoxazole) PMH-FH-SH reviewed for relevance  Review of Systems General:  Denies chills and fever. CV:  Denies chest pain or discomfort, palpitations, shortness of breath with exertion, and swelling of feet. Resp:  Denies cough and shortness of breath. GI:  Denies change in bowel habits. MS:  Complains of joint pain, loss of strength, low back pain, and cramps; denies joint redness and joint swelling. Neuro:  Denies falling down, numbness, and tingling.  Physical Exam  General:  Well-developed, well-nourished, in no acute distress; alert, appropriate and cooperative throughout examination. Vitals reviewed. Msk:  Marked decreased in ROM of lumbar spine 2/2 "pulling pain.".  + tenderness over left lumbar paraspinal mm, + negative straight leg raise on  right 2/2 pain, + pain on stressing R SI joint. + point tenderness at right hip bursa. + FABER on right. Strength slightly decreased on right LE compared to left LE. Pulses:  2 + peripheral pulses. Extremities:  No clubbing, cyanosis, edema, or deformity. Neurologic:  DTR + 2 LE.   Impression & Recommendations:  Problem # 1:  BACK PAIN WITH RADICULOPATHY (ICD-729.2) Assessment Unchanged Patient with slight weakness LLE compared to RLE. Also, with SI tenderness. Will send to PT for strengthening and stretching. Orders: Physical Therapy Referral (PT) [facility]- Est  Level 4 [id number])  Problem # 2:  HIP PAIN, RIGHT (ICD-719.45) Assessment: New Etiology DDx includes L4/L5 (fits with nerve distribution), hip bursitis. Injected hip today. - Risk and benefits of procedure discussed. Consent obtained. Right hip - area cleaned with betadine and alcohol. bursa injected with 4 mL 1% lidocaine with epi and 1 mL of Kenalog 40. No bleeding. Paient tolerated procedure well. Time In/Out completed. The following medications were removed from the medication list:    Ultram 50 Mg Tabs (Tramadol hcl) ..... One tab three times a day with tylenol Her updated medication list for this problem includes:    Meloxicam 15 Mg Tabs (Meloxicam) ..... One by mouth daily  Orders: [place] [facility] [place]- Est  Level 4 [id number])  Problem # 3:  LEG CRAMPS (ICD-729.82) Assessment: New Check Labs to look for electrolyte cause. Rx Requip to see if there is response (RLS). Will also consider neurontin  if medication change needed.  Orders: CBC-[facility] [id number]) CK (Creatine Kinase)-[facility] [phone]) Vit D, 25 [facility] [id number]) Basic Met-[facility] [id number]) [facility]- Est  Level 4 [id number])  Complete Medication List: 1)  Sm Calcium/vitamin D 500-200 Mg-unit Tabs (Calcium carbonate-vitamin d) .... Take 1 tablet by mouth three times a day 2)  Requip 1 Mg Tabs (Ropinirole hydrochloride) .Marland Kitchen.. 1 at hs 3)  Meloxicam 15 Mg Tabs (Meloxicam) .... One by mouth  daily  Other Orders: TSH-[facility] [id number])  Patient Instructions: 1)  It was nice to meet you today! 2)  We are prescribing Meloxicam as needed for pain. 3)  Take the Requip at night for leg cramps. 4)  We will call you with lab results. Prescriptions: MELOXICAM 15 MG TABS (MELOXICAM) one by mouth daily  #30 x 1   Entered and Authorized by:   Briscoe Deutscher DO   Signed by:   Briscoe Deutscher DO on 12/05/2009   Method used:   Print then Give to Patient   RxID:   [id number] REQUIP 1 MG TABS (ROPINIROLE HYDROCHLORIDE) 1 at hs  #30 x 0   Entered and Authorized by:   Briscoe Deutscher DO   Signed by:   Briscoe Deutscher DO on 12/05/2009   Method used:   Print then Give to Patient   RxID:   [id number]   Prevention & Chronic Care Immunizations   Influenza vaccine: Not documented   Influenza vaccine deferral: Not indicated  (12/05/2009)    Tetanus booster: 04/22/1999: Done.   Tetanus booster due: 04/21/2009    Pneumococcal vaccine: Not documented  Colorectal Screening   Hemoccult: Not documented   Hemoccult action/deferral: Not indicated  (12/05/2009)    Colonoscopy: Not documented  Other Screening   Pap smear: Normal  (11/24/2007)   Pap smear due: 11/2010    Mammogram: Normal  (08/02/2007)   Mammogram due: 07/2008   Smoking status: never  (12/05/2009)  Lipids   Total Cholesterol: 193  (07/16/2007)   LDL: 117  (07/16/2007)   LDL Direct: 124  (10/21/2006)   HDL: 52  (07/16/2007)   Triglycerides: 119  (07/16/2007)    SGOT (AST): 17  (03/30/2008)   SGPT (ALT): 17  (03/30/2008)   Alkaline phosphatase: 60  (03/30/2008)   Total bilirubin: 0.6  (03/30/2008)    Lipid flowsheet reviewed?: Yes   Progress toward LDL goal: Unchanged  Self-Management Support :   Personal Goals (by the next clinic visit) :      Personal LDL goal: 100  (12/05/2009)    Patient will work on the following items until the next clinic visit to reach self-care goals:     Medications and  monitoring: take my medicines every day, bring all of my medications to every visit  (12/05/2009)     Eating: drink diet soda or water instead of juice or soda, eat more vegetables, use fresh or frozen vegetables, eat foods that are low in salt, eat baked foods instead of fried foods, eat fruit for snacks and desserts, limit or avoid alcohol  (12/05/2009)     Activity: take a 30 minute walk every day, take the stairs instead of the elevator, park at the far end of the parking lot  (12/05/2009)    Lipid self-management support: Written self-care plan  (12/05/2009)   Lipid self-care plan printed.

## 2010-05-23 NOTE — Miscellaneous (Signed)
Summary: Consent Hip Injection  Consent Hip Injection   Imported By: Raymond Gurney 12/07/2009 15:01:14  _____________________________________________________________________  External Attachment:    Type:   Image     Comment:   External Document

## 2010-05-23 NOTE — Miscellaneous (Signed)
  Clinical Lists Changes  Problems: Removed problem of LEG CRAMPS (ICD-729.82) Removed problem of LUMBAR STRAIN (ICD-847.2) Removed problem of SCREENING FOR MALIGNANT NEOPLASM OF THE CERVIX (ICD-V76.2) Removed problem of WELL WOMAN (ICD-V70.0) Removed problem of SPECIAL SCREENING FOR MALIGNANT NEOPLASMS COLON (ICD-V76.51) Removed problem of OTHER SCREENING BREAST EXAMINATION (ICD-V76.19)

## 2010-06-12 ENCOUNTER — Encounter: Payer: Self-pay | Admitting: *Deleted

## 2010-06-21 ENCOUNTER — Ambulatory Visit (HOSPITAL_COMMUNITY)
Admission: RE | Admit: 2010-06-21 | Discharge: 2010-06-21 | Disposition: A | Discharge status: Home / Self Care | Payer: Self-pay | Source: Ambulatory Visit | Attending: Family Medicine | Admitting: Family Medicine

## 2010-06-21 ENCOUNTER — Other Ambulatory Visit: Payer: Self-pay | Admitting: Family Medicine

## 2010-06-21 DIAGNOSIS — E049 Nontoxic goiter, unspecified: Secondary | ICD-10-CM | POA: Insufficient documentation

## 2010-06-21 DIAGNOSIS — M542 Cervicalgia: Secondary | ICD-10-CM | POA: Insufficient documentation

## 2010-06-21 DIAGNOSIS — R52 Pain, unspecified: Secondary | ICD-10-CM

## 2010-06-21 DIAGNOSIS — R221 Localized swelling, mass and lump, neck: Secondary | ICD-10-CM | POA: Insufficient documentation

## 2010-06-21 DIAGNOSIS — R220 Localized swelling, mass and lump, head: Secondary | ICD-10-CM | POA: Insufficient documentation

## 2010-08-09 ENCOUNTER — Telehealth: Payer: Self-pay | Admitting: Family Medicine

## 2010-08-09 NOTE — Telephone Encounter (Signed)
Would like a copy of results of her MRI and last lab results

## 2010-08-09 NOTE — Telephone Encounter (Signed)
Forward to [employer]

## 2010-08-27 NOTE — Telephone Encounter (Signed)
Called patient back and reviewed the results.  Will f/u in the next month.

## 2010-09-06 NOTE — Op Note (Signed)
[name], [name]                          ACCOUNT NO.:  [id number]   MEDICAL RECORD NO.:  [id number]                   PATIENT TYPE:  AMB   LOCATION:  [place]                                  FACILITY:  [facility]   PHYSICIAN:  Daryll Brod, M.D.                    DATE OF BIRTH:  1955-05-31   DATE OF PROCEDURE:  04/07/2002  DATE OF DISCHARGE:                                 OPERATIVE REPORT   PREOPERATIVE DIAGNOSIS:  Ruptured sagittal fibers, extensor hood, left  middle finger.   POSTOPERATIVE DIAGNOSIS:  Ruptured sagittal fibers, extensor hood, left  middle finger.   OPERATION:  Reconstruction of extensor hood, left middle finger.   SURGEON:  Daryll Brod, M.D.   ANESTHESIA:  Forearm-based IV regional.   HISTORY:  The patient is a 55 year old female with a history of an injury to  her left middle finger with subluxation of the extensor tendon into the  ulnar gutter.   DESCRIPTION OF PROCEDURE:  The patient was brought to the operating room  where a forearm-based IV regional anesthetic was carried out without  difficulty.  She was prepped and draped using Betadine scrubbing solution,  left arm free, supine position.  A curvilinear incision was made over the  radial side of the metacarpophalangeal joint and carried down through  subcutaneous tissue.  Bleeders were electrocauterized.  Neurovascular  structures were otherwise protected.  The stretched sagittal fibers on the  radial side were immediately apparent.  An incision was made in the extensor  tendon, ulnar aspect, taking approximately a 1- to 2-mm segment.  This was  dissected distally, leaving the juncture intact.  A hole was then made in  the extensor tendon.  The dissection was carried  between the metacarpal  heads, index and middle fingers, radial side, until the intermetacarpal  ligament was identified.  A right-angle hemostat was then placed volar to  the intermetacarpal ligament and the extensor tendon segment was  then  wrapped volar to the intermetacarpal ligament, brought up and sutured to  itself, stabilizing the extensor tendon in its centralized position.  The  finger was placed through a full range of motion.  This was sutured into  position with figure-of-eight 4-0 [place] sutures.  The wound was  irrigated.  The skin was closed with interrupted 5-0 nylon sutures.  A  sterile compressive dressing and splint were applied.  The patient tolerated  the procedure well and was taken to the recovery room for observation in  satisfactory condition.   She is discharged home to [facility] to [facility] in one  week on Vicodin and Keflex.  Daryll Brod, M.D.    GK/MEDQ  D:  04/07/2002  T:  04/08/2002  Job:  [id number]

## 2010-12-30 ENCOUNTER — Encounter: Payer: Self-pay | Admitting: Family Medicine

## 2010-12-30 ENCOUNTER — Ambulatory Visit (INDEPENDENT_AMBULATORY_CARE_PROVIDER_SITE_OTHER): Payer: Self-pay | Admitting: Family Medicine

## 2010-12-30 DIAGNOSIS — M549 Dorsalgia, unspecified: Secondary | ICD-10-CM

## 2010-12-30 DIAGNOSIS — IMO0002 Reserved for concepts with insufficient information to code with codable children: Secondary | ICD-10-CM

## 2010-12-30 DIAGNOSIS — N189 Chronic kidney disease, unspecified: Secondary | ICD-10-CM

## 2010-12-30 LAB — BASIC METABOLIC PANEL
BUN: 25 mg/dL — ABNORMAL HIGH (ref 6–23)
CO2: 24 mEq/L (ref 19–32)
Calcium: 10.7 mg/dL — ABNORMAL HIGH (ref 8.4–10.5)
Creat: 1.5 mg/dL — ABNORMAL HIGH (ref 0.50–1.10)
Glucose, Bld: 91 mg/dL (ref 70–99)

## 2010-12-30 MED ORDER — GABAPENTIN 100 MG PO CAPS: 100.0000 mg | ORAL_CAPSULE | Freq: Three times a day (TID) | ORAL | Status: DC

## 2010-12-30 NOTE — Assessment & Plan Note (Addendum)
Acute flare today. Trigger point injections x 2 to most affected areas of back bilaterally. NSAIDs avoided in setting of hx/o CRI. Consent obtained prior to procedure with risks and benefits discussed. Time out occuring prior to procedure. Injections performed in sterile fashion with bandages placed over sites of injection. -Will start pt on neurontin give radicular portion of pain  -Will also re-refer to PT as pt reports some improvement in function with this in the past.  -Will f/u in 2 weeks.  -May consider addition of lidoderm patches as these are now generic.

## 2010-12-30 NOTE — Patient Instructions (Signed)
It was good to meet you today  I am referring you to physical therapy for your back  I am also starting you on neurontin for your back pain  Try taking the medication at night to make sure that it doesn't make you too sleepy Avoid any aleve, ibuprofen, or naproxen as this can hurt your kidneys,  Come back to see me in 2 weeks, Call with any questions,  God Bless, Shanda Howells MD

## 2010-12-30 NOTE — Progress Notes (Signed)
Referral info faxed to [facility] on [address] per patient's request.

## 2010-12-30 NOTE — Progress Notes (Signed)
  Subjective:    Patient ID: [name], female    DOB: May 07, 1955, 55 y.o.   MRN: [id number]  HPI Back pain x  4 days. This has been a chronic problem. Pt with baseline L4-L5 ruptured disks. This occurred in 10/2000 per pt. Pt has been seen by [facility] neurosurgery for this in the past. No surgery indicated at that time. Pt received PT, and injections. Pt states that she has had intermittent flares of back pain since this point. Pt denies any recent traumas to the area. No strenuous activity. No recent illness. Pt has been taking aleve and tylenol with minimal improvement in symptoms. Pain has been most prominent in lumbar region bilaterally.  Has had some radiation of pain to R Leg with involvement below the knee. No bowel or bladder anesthesia. Overall distribution of pain is consistent with previous flares. Intensity is mildy increased during this episode. Pt has been able to ambulate with some ROM.  Review of Systems See HPI    Objective:   Physical Exam Gen: up in chair, in mild distress 2/2 pain  MSK: + TTP in lumbar region bilaterally + Lumbar pain with internal/external rotation of hips bilaterally. Sensation intact in LEs bilaterally.   Assessment & Plan:

## 2011-01-20 ENCOUNTER — Telehealth: Payer: Self-pay | Admitting: Family Medicine

## 2011-01-20 NOTE — Telephone Encounter (Signed)
Pt is asking for a copy of her last labs - pls call when ready

## 2011-01-20 NOTE — Telephone Encounter (Signed)
Printed out lab results and mailed to pt per pt request.

## 2011-02-11 ENCOUNTER — Ambulatory Visit (INDEPENDENT_AMBULATORY_CARE_PROVIDER_SITE_OTHER): Payer: Self-pay | Admitting: Family Medicine

## 2011-02-11 ENCOUNTER — Encounter: Payer: Self-pay | Admitting: Family Medicine

## 2011-02-11 VITALS — BP 151/78 | HR 98 | Ht 65.0 in | Wt 165.0 lb

## 2011-02-11 DIAGNOSIS — I10 Essential (primary) hypertension: Secondary | ICD-10-CM

## 2011-02-11 DIAGNOSIS — N19 Unspecified kidney failure: Secondary | ICD-10-CM

## 2011-02-11 DIAGNOSIS — IMO0002 Reserved for concepts with insufficient information to code with codable children: Secondary | ICD-10-CM

## 2011-02-11 DIAGNOSIS — M549 Dorsalgia, unspecified: Secondary | ICD-10-CM

## 2011-02-11 DIAGNOSIS — N183 Chronic kidney disease, stage 3 unspecified: Secondary | ICD-10-CM

## 2011-02-11 LAB — POCT UA - MICROSCOPIC ONLY

## 2011-02-11 LAB — POCT URINALYSIS DIPSTICK
Bilirubin, UA: NEGATIVE
Nitrite, UA: NEGATIVE
Protein, UA: 100
pH, UA: 5.5

## 2011-02-11 MED ORDER — HYDROCODONE-ACETAMINOPHEN 5-500 MG PO TABS: 1.0000 | ORAL_TABLET | ORAL | Status: AC | PRN

## 2011-02-11 NOTE — Progress Notes (Signed)
  Subjective:    Patient ID: [name], female    DOB: 1955/04/22, 55 y.o.   MRN: [id number]  HPI Pt is here for reevaluation of back pain. Pt was previously seen by me for this on 12/30/10. Trigger point injections x 2 were performed at this time. Pt states that helped pain for approx 2-3 weeks. Pt was also placed on neurontin given that there was some radiular component to sxs. Pt states that this has helped pain somewhat.  Pt states that she has had an acute spike in back pain over the last 1-2 weeks. 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 Pt denies any strenuous activity, or lifting of heavy objects. Pt denies any hx/o kidney stones.  Review of Systems See HPI     Objective:   Physical Exam Physical Exam  Gen: up in chair, in mild distress 2/2 pain  MSK: + TTP in lumbar region bilaterally + Lumbar pain with internal/external rotation of hips bilaterally. Sensation intact in LEs bilaterally.    Assessment & Plan:

## 2011-02-11 NOTE — Patient Instructions (Signed)
Kidney Failure In kidney failure, the kidneys lose their ability to filter enough waste products from the blood. They also lose the ability to regulate the body's balance of salt and water. Eventually, the kidneys slow their production of urine or stop producing it completely. Waste products and water gather in the body. This can lead to a life-threatening overload of fluids (such as heart failure). It can also lead to a dangerous buildup of waste products in the blood. These extreme changes in blood chemistry can affect the function of the heart and brain.  TYPES OF KIDNEY FAILURE Acute kidney failure. In this form of kidney failure, the kidneys stop working properly because of a sudden illness, a medicine, or a medical condition that causes one of the following:   A severe drop in blood pressure or an interruption in the normal blood flow to the kidneys. This can occur during:   Major surgery.   Severe burns with fluid loss.   Massive bleeding.   A heart attack that severely affects heart function.   Blood clots that travel to the kidney.   Direct damage to kidney cells or to the kidneys' filtering units. This can be caused by:   An inflammation of the kidneys.   Toxic chemicals.   Medicines or infections.   Blocked urine flow from the kidney. This can occur because of obstructions outside the kidney, such as:   Kidney stones.   Bladder tumors.   An enlarged prostate.  Blockage of urine flow within the kidney can also cause sudden kidney failure, as can occur with large muscle injuries.   Chronic kidney failure. In this form of kidney failure, the kidney gradually loses function. This happens over a period of years. It is a slow and gradual loss of the ability of the kidneys to send out wastes, concentrate urine, and conserve the salts in your blood. Some of the causes of chronic kidney failure are:  Diabetes (very common cause).   Polycystic kidney disease.    Glomerulonephritis.   Alport syndrome.   The flow of urine out of the kidney is blocked (obstructive uropathy).   High blood pressure (very common).   Long-term exposure to lead, mercury, and other chemicals and medicines.   Kidney stones with infection.   Reflux nephropathy.   Pain medicine overuse.  Some forms of chronic kidney failure run in families. Your caregiver will ask you about family medical problems.  End-stage kidney disease (ESKD). This is also called end-stage kidney failure. In ESKD, kidney function worsens until the person dies. This is usually the result of longstanding chronic kidney failure, but sometimes it follows acute kidney failure. SYMPTOMS  Symptoms vary depending on the type of kidney failure.   Acute kidney failure. Symptoms include:   Swelling (edema) resulting from salt and water overload.   High blood pressure.   Vomiting.   Tiredness (lethargy) caused by the toxic effects of waste products on brain function.   Feeling sick to your stomach (nauseous).   Decreased urine output.   Chronic kidney failure and ERSD. Because the kidney damage in chronic kidney failure occurs slowly over a long time, symptoms develop slowly. Symptoms can include:   Headache.   Weakness.   Itching.   Vomiting.   Pale skin.   Slowing of growth in children.   Fatigue.   Tiredness (lethargy).   Poor appetite.   Increased thirst.   High blood pressure.   Bone damage in adults.  DIAGNOSIS  If  you have an illness or medical condition that increases the risk of acute kidney failure, your caregivers will watch you closely. You may have blood and urine tests that measure the function of your kidneys. If you have a medical condition that increases the risk of long-term kidney damage, your caregiver will check your blood pressure and look for symptoms of chronic kidney failure during rechecks. TREATMENT  Treatment depends on the type of kidney failure.    Acute kidney failure. Treatment begins with measures to correct the cause of kidney failure (shock, hemorrhage, burns, heart attack). After this has begun, more specific kidney treatment may include:   Fluids given through the vein (intravenously) to correct any abnormal fluid loss.   Medicines called diuretics that increase urine output.   Limited fluids by mouth.   A diet low in protein and high in carbohydrates.   Medicines to adjust high or low levels of blood chemicals, such as potassium and medicines to control high blood pressure.   Short-term dialysis may be necessary if the patient develops severe high blood pressure, severe fluid overload, heart failure, symptoms of altered brain function, or severe abnormalities in blood chemistry.   Chronic kidney failure. People with chronic kidney failure are watched closely. They receive frequent physical exams, blood pressure checks, and blood testing. Treatment includes:   A low-protein and low-salt diet.   Medicines to adjust blood chemical levels.   Medicines to treat high blood pressure.   Sometimes, a hormonal medicine called erythropoietin is given to correct a low level of red blood cells (anemia).   ESKD. Treatment includes:   Dialysis until a donor can be found for a kidney transplant. Dialysis mechanically removes waste products from the blood.   Both kidneys may need to be removed surgically before a transplant in patients with severe high blood pressure or chronic pyelonephritis.  PROGNOSIS   Acute kidney failure may go away on its own. Some people recover within a matter of days. Exactly how long the illness lasts varies greatly from person-to-person. The duration depends on the cause of the kidney problem. In rare cases, acute kidney failure progresses to ESKD. Among people who recover, about 50% have some permanent kidney damage. In most cases, this is not severe enough to prevent you from living a normal life.    Chronic kidney failure is a lifelong problem that can worsen over time to become ESKD. Not everyone develops ESKD. For those who do, the time it takes for ESKD to develop varies from person-to-person.   ESKD is a permanent condition that can be treated only with dialysis or a kidney transplant.  PREVENTION  Many forms of kidney failure cannot be prevented. People who have diabetes, high blood pressure, or coronary artery disease should try to control the illness with:  Appropriate diet.   Medicine.   Lifestyle changes.  If you have chronic kidney failure, you should tell all caregivers who treat you.  HOME CARE INSTRUCTIONS   Follow your diet and take your medicines as instructed.   Do not use any new medicines (prescription, over-the-counter, or nutritional supplements) unless approved by your caregiver. Many medicines can worsen your kidney damage or need to have the dose adjusted.   If dialysis is scheduled, keep all appointments. Call if you are unable to keep an appointment.  SEEK MEDICAL CARE IF:   You develop unexplained weakness, tiredness, or appetite loss.   You feel poorly with no clear explanation.  SEEK IMMEDIATE MEDICAL CARE IF:  The amount of urine you produce either distinctly increases or decreases.   You develop swelling of the face and/or ankles.   You develop shortness of breath.  [place] of Diabetes and Digestive and Kidney Diseases: [employer] National Kidney Foundation: www.kidney.org Document Released: 04/07/2005 Document Revised: 12/18/2010 Document Reviewed: 08/08/2009 [facility] Patient Information 2012 [place].Back Pain, Adult Low back pain is very common. About 1 in 5 people have back pain.The cause of low back pain is rarely dangerous. The pain often gets better over time.About half of people with a sudden onset of back pain feel better in just 2 weeks. About 8 in 10 people feel better by 6 weeks.   CAUSES Some common causes of back pain include:  Strain of the muscles or ligaments supporting the spine.   Wear and tear (degeneration) of the spinal discs.   Arthritis.   Direct injury to the back.  DIAGNOSIS Most of the time, the direct cause of low back pain is not known.However, back pain can be treated effectively even when the exact cause of the pain is unknown.Answering your caregiver's questions about your overall health and symptoms is one of the most accurate ways to make sure the cause of your pain is not dangerous. If your caregiver needs more information, he or she may order lab work or imaging tests (X-rays or MRIs).However, even if imaging tests show changes in your back, this usually does not require surgery. HOME CARE INSTRUCTIONS For many people, back pain returns.Since low back pain is rarely dangerous, it is often a condition that people can learn to [facility] their own.   Remain active. It is stressful on the back to sit or stand in one place. Do not sit, drive, or stand in one place for more than 30 minutes at a time. Take short walks on level surfaces as soon as pain allows.Try to increase the length of time you walk each day.   Do not stay in bed.Resting more than 1 or 2 days can delay your recovery.   Do not avoid exercise or work.Your body is made to move.It is not dangerous to be active, even though your back may hurt.Your back will likely heal faster if you return to being active before your pain is gone.   Pay attention to your body when you bend and lift. Many people have less discomfortwhen lifting if they bend their knees, keep the load close to their bodies,and avoid twisting. Often, the most comfortable positions are those that put less stress on your recovering back.   Find a comfortable position to sleep. Use a firm mattress and lie on your side with your knees slightly bent. If you lie on your back, put a pillow under your knees.   Only  take over-the-counter or prescription medicines as directed by your caregiver. Over-the-counter medicines to reduce pain and inflammation are often the most helpful.Your caregiver may prescribe muscle relaxant drugs.These medicines help dull your pain so you can more quickly return to your normal activities and healthy exercise.   Put ice on the injured area.   Put ice in a plastic bag.   Place a towel between your skin and the bag.   Leave the ice on for 15 to 20 minutes, 3 to 4 times a day for the first 2 to 3 days. After that, ice and heat may be alternated to reduce pain and spasms.   Ask your caregiver about trying back exercises and gentle massage.  This may be of some benefit.   Avoid feeling anxious or stressed.Stress increases muscle tension and can worsen back pain.It is important to recognize when you are anxious or stressed and learn ways to manage it.Exercise is a great option.  SEEK MEDICAL CARE IF:  You have pain that is not relieved with rest or medicine.   You have pain that does not improve in 1 week.   You have new symptoms.   You are generally not feeling well.  SEEK IMMEDIATE MEDICAL CARE IF:   You have pain that radiates from your back into your legs.   You develop new bowel or bladder control problems.   You have unusual weakness or numbness in your arms or legs.   You develop nausea or vomiting.   You develop abdominal pain.   You feel faint.  Document Released: 04/07/2005 Document Revised: 12/18/2010 Document Reviewed: 08/26/2010 [facility] Patient Information 2012 [place].

## 2011-02-12 LAB — PTH, INTACT AND CALCIUM: PTH: 117.2 pg/mL — ABNORMAL HIGH (ref 14.0–72.0)

## 2011-02-12 LAB — BASIC METABOLIC PANEL
BUN: 27 mg/dL — ABNORMAL HIGH (ref 6–23)
Chloride: 101 mEq/L (ref 96–112)
Creat: 1.63 mg/dL — ABNORMAL HIGH (ref 0.50–1.10)

## 2011-02-14 ENCOUNTER — Telehealth: Payer: Self-pay | Admitting: *Deleted

## 2011-02-14 ENCOUNTER — Encounter: Payer: Self-pay | Admitting: Family Medicine

## 2011-02-14 HISTORY — DX: Hypercalcemia: E83.52

## 2011-02-14 NOTE — Assessment & Plan Note (Addendum)
Will start pt on low dose narcotics for this. Given direct contraindication to NSAIDs in setting of CRI, i feel this would be the best option currently. Neurontin is currenly renally dosed.  WIll also check UA to rule out nephrolithiasis (hematuria) as source of back pain.

## 2011-02-14 NOTE — Assessment & Plan Note (Signed)
Noted elevated Calcium with most recent BMET. Pt is noted to be on Vit D for osteopenia. Will obtain workup for this including intact PTH.Given baseline CRI, this may also be a consideration.

## 2011-02-14 NOTE — Telephone Encounter (Signed)
Spoke with patient, informed her that she needs to renew her orange card in order for [place] to refer her to a nephrologist. This was also explained to her by Leonia Reader.Kadijah Shamoon, Kevin Fenton

## 2011-02-14 NOTE — Assessment & Plan Note (Addendum)
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 Will refer pt to nephrology for further evaluation. CRI handout given.

## 2011-02-24 ENCOUNTER — Telehealth: Payer: Self-pay | Admitting: Family Medicine

## 2011-02-24 NOTE — Telephone Encounter (Signed)
Need lab results from last visit.  Will pick up when ready.

## 2011-02-24 NOTE — Telephone Encounter (Signed)
Fwd. To Dr.Newton for review .[name]

## 2011-03-06 NOTE — Telephone Encounter (Signed)
Called pt to ask about [facility] card.  She is inquiring about her labs drawn on 10.23.12.   Advised I would send message to MD.  MD PLEASE call [place], [name]

## 2011-03-19 ENCOUNTER — Encounter: Payer: Self-pay | Admitting: Family Medicine

## 2011-03-19 NOTE — Telephone Encounter (Signed)
Attempted to call pt back to discuss lab results x 2 over last 3 days. Will send result letter out.

## 2011-12-30 ENCOUNTER — Ambulatory Visit (INDEPENDENT_AMBULATORY_CARE_PROVIDER_SITE_OTHER): Payer: Self-pay | Admitting: Family Medicine

## 2011-12-30 ENCOUNTER — Encounter: Payer: Self-pay | Admitting: Family Medicine

## 2011-12-30 ENCOUNTER — Other Ambulatory Visit: Payer: Self-pay | Admitting: Family Medicine

## 2011-12-30 VITALS — BP 152/80 | HR 71 | Temp 98.6°F | Ht 65.0 in | Wt 162.5 lb

## 2011-12-30 DIAGNOSIS — Z1231 Encounter for screening mammogram for malignant neoplasm of breast: Secondary | ICD-10-CM

## 2011-12-30 DIAGNOSIS — R519 Headache, unspecified: Secondary | ICD-10-CM | POA: Insufficient documentation

## 2011-12-30 DIAGNOSIS — R51 Headache: Secondary | ICD-10-CM

## 2011-12-30 MED ORDER — NAPROXEN 500 MG PO TABS: 500.0000 mg | ORAL_TABLET | Freq: Two times a day (BID) | ORAL | Status: DC

## 2011-12-30 MED ORDER — PROCHLORPERAZINE MALEATE 10 MG PO TABS: 10.0000 mg | ORAL_TABLET | Freq: Four times a day (QID) | ORAL | Status: DC | PRN

## 2011-12-30 NOTE — Patient Instructions (Signed)
It was good to see you today! I will call you with results of your head scan. Please take the medicines I sent in (Naproxen 500mg  two times daily and Compazine as needed for nausea).  If you are still having problems in a week, come back to see me.

## 2011-12-30 NOTE — Assessment & Plan Note (Signed)
New onset headache that is different from patient's normal.  Focality, family history, and associated symptoms worrisome for intracranial pathology.  Will obtain neuro-imaging and then touch base with patient.  In mean time, will treat with naproxen and compazine for symptom control.  If neuro-imaging normal will treat as cluster-type headache and consider adding Verapamil as ppx if continues beyond a week.

## 2011-12-30 NOTE — Progress Notes (Signed)
Patient ID: BELLINA BUREAU, female   DOB: 08-23-1955, 56 y.o.   MRN: [id number] Subjective: The patient is a 56 y.o. year old female who presents today for headache.  1. Headache:  Right sided, associated with some blurred vision.  This is different from previous headaches.  Reports is somewhat more localized than her normal migraines.  Does have nausea, feels a bit off balance.  Headache develops throughout the day.  Has some problems with redness in right eye.  This has been going on for the last week or so.  Patient reports family history of cerebral aneurisms.  (Mother and sister died from them)  Patient's past medical, social, and family history were reviewed and updated as appropriate. History  Substance Use Topics  . Smoking status: Never Smoker   . Smokeless tobacco: Not on file  . Alcohol Use: Not on file   Objective:  Filed Vitals:   12/30/11 1003  BP: 152/80  Pulse: 71  Temp: 98.6 F (37 C)   Gen: NAD HEENT: MMM, EMOI, PERRL, fundi benign, no adenopathy.  No tongue or uvula deviation.  Assessment/Plan:  Please also see individual problems in problem list for problem-specific plans.

## 2011-12-31 ENCOUNTER — Ambulatory Visit (HOSPITAL_COMMUNITY): Payer: Self-pay

## 2011-12-31 ENCOUNTER — Ambulatory Visit (HOSPITAL_COMMUNITY)
Admission: RE | Admit: 2011-12-31 | Discharge: 2011-12-31 | Disposition: A | Discharge status: Home / Self Care | Payer: Self-pay | Source: Ambulatory Visit | Attending: Family Medicine | Admitting: Family Medicine

## 2011-12-31 DIAGNOSIS — R51 Headache: Secondary | ICD-10-CM | POA: Insufficient documentation

## 2011-12-31 DIAGNOSIS — Z8679 Personal history of other diseases of the circulatory system: Secondary | ICD-10-CM | POA: Insufficient documentation

## 2012-01-01 ENCOUNTER — Encounter: Payer: Self-pay | Admitting: Family Medicine

## 2012-01-05 ENCOUNTER — Telehealth: Payer: Self-pay | Admitting: *Deleted

## 2012-01-05 ENCOUNTER — Other Ambulatory Visit (HOSPITAL_COMMUNITY): Payer: Self-pay

## 2012-01-05 ENCOUNTER — Encounter: Payer: Self-pay | Admitting: Family Medicine

## 2012-01-05 ENCOUNTER — Ambulatory Visit (INDEPENDENT_AMBULATORY_CARE_PROVIDER_SITE_OTHER): Payer: Self-pay | Admitting: Family Medicine

## 2012-01-05 VITALS — BP 138/79 | HR 80 | Temp 99.0°F | Ht 65.0 in | Wt 157.0 lb

## 2012-01-05 DIAGNOSIS — R102 Pelvic and perineal pain: Secondary | ICD-10-CM | POA: Insufficient documentation

## 2012-01-05 DIAGNOSIS — R300 Dysuria: Secondary | ICD-10-CM

## 2012-01-05 DIAGNOSIS — R109 Unspecified abdominal pain: Secondary | ICD-10-CM

## 2012-01-05 LAB — POCT URINALYSIS DIPSTICK
Ketones, UA: NEGATIVE
Leukocytes, UA: NEGATIVE
Nitrite, UA: NEGATIVE
Protein, UA: >=300
pH, UA: 5.5

## 2012-01-05 LAB — CBC WITH DIFFERENTIAL/PLATELET
Eosinophils Absolute: 0.1 10*3/uL (ref 0.0–0.7)
HCT: 38 % (ref 36.0–46.0)
Hemoglobin: 12.6 g/dL (ref 12.0–15.0)
Lymphs Abs: 2.5 10*3/uL (ref 0.7–4.0)
MCH: 27.8 pg (ref 26.0–34.0)
MCV: 83.9 fL (ref 78.0–100.0)
Monocytes Relative: 7 % (ref 3–12)
Neutrophils Relative %: 53 % (ref 43–77)
RBC: 4.53 MIL/uL (ref 3.87–5.11)

## 2012-01-05 LAB — POCT UA - MICROSCOPIC ONLY: RBC, urine, microscopic: >20

## 2012-01-05 LAB — BASIC METABOLIC PANEL
BUN: 23 mg/dL (ref 6–23)
CO2: 29 mEq/L (ref 19–32)
Calcium: 9.9 mg/dL (ref 8.4–10.5)
Creat: 1.64 mg/dL — ABNORMAL HIGH (ref 0.50–1.10)

## 2012-01-05 NOTE — Telephone Encounter (Signed)
Patient called back.  Unable to go to appt until after 4:00pm today because she has to pick up "the children" from school.  Radiology unable to schedule an appt that late.  Appt scheduled for tomorrow morning at 9:30am and patient informed.  Patient will call if unable to keep appt.  Nolene Ebbs, RN

## 2012-01-05 NOTE — Telephone Encounter (Signed)
[facility] Radiology & scheduled an appt for patient to have renal ultrasound today.  Radiology would like for patient to come to office now.  Patient had office visit this morning and was informed I would call her for an ultrasound appt today.  She informed me to call her on her cell phone [phone]).  Called patient x 3 (1:40pm, 1:50pm, and 2:00pm) and left message to call our office back ASAP for appt today.  Will await call back from patient.  Nolene Ebbs, RN

## 2012-01-05 NOTE — Patient Instructions (Addendum)
I am concerned you have urine backing up into kidneys. Have your ultrasound done as soon as possible. We will call with appointment. If you have fevers, vomiting, severe pain then go to the ER. Make an appointment for follow up in 2 days.

## 2012-01-05 NOTE — Progress Notes (Signed)
  Subjective:    Patient ID: [name], female    DOB: 12/25/1955, 56 y.o.   MRN: [id number]  HPI  56 year old female with a history of CKD stage 3 presents with 2 days urinary frequency, hesitancy, suprapubic pressure and feeling of incomplete bladder emptying. She also endorses right greater than left low back pain. States this feels like previous episodes "when my creatinine goes up". States she has a history of renal cyst and hematuria and has seen a urologist and nephrologist previously. She is eating well and staying hydrated. Endorses mild nausea, headache, calf cramps. Denies history of renal stone.   Denies any fever, chills, weight loss, edema, hematuria, rash. Endorsed family history of renal cysts.  There is a history of left complex renal cyst on [place] in 2008 without any growth.   Review of Systems See HPI otherwise negative.  reports that she has never smoked. She does not have any smokeless tobacco history on file.    Objective:   Physical Exam  Vitals reviewed. Constitutional: She is oriented to person, place, and time. She appears well-developed and well-nourished. No distress.  HENT:  Head: Normocephalic and atraumatic.  Mouth/Throat: Oropharynx is clear and moist.  Eyes: EOM are normal.  Cardiovascular: Normal rate, regular rhythm and normal heart sounds.   No murmur heard. Pulmonary/Chest: Effort normal and breath sounds normal. No respiratory distress. She has no wheezes. She has no rales.  Abdominal: Soft. Bowel sounds are normal. She exhibits no distension. There is tenderness. There is no rebound and no guarding.       Has mild right CVA tenderness.  + Suprapubic tenderness, no mass detected.  Musculoskeletal: She exhibits no edema and no tenderness.  Neurological: She is alert and oriented to person, place, and time. No cranial nerve deficit. She exhibits normal muscle tone. Coordination normal.  Skin: No rash noted. She is not diaphoretic.  Psychiatric: She  has a normal mood and affect.        Assessment & Plan:

## 2012-01-05 NOTE — Assessment & Plan Note (Signed)
Greatest concern for obstruction or possible renal stone with her symptoms and UA findings. She has recently been on compazine and NSAIDs for headaches, which could be related. UA does not appear to show infection, she is afebrile and stable currently. I recommended an I&O cath today to assess if acute urinary obstruction is present, but patient refused this. Will check renal ultrasound ASAP, check BMET and CBC, urine culture.  If there is urinary retention or hydronephrosis would need to have foley placed and urgent urology referral (she has already been to [facility] per her history for persistent hematuria). If there is nephrolithiasis without obstruction will continue conservative management with close outpatient f/u or consider CT scanning. Advised to push oral fluids, discussed red flags to seek emergency care. Follow up in 2 days or call for problems sooner.

## 2012-01-06 ENCOUNTER — Telehealth: Payer: Self-pay | Admitting: *Deleted

## 2012-01-06 ENCOUNTER — Ambulatory Visit (HOSPITAL_COMMUNITY)
Admission: RE | Admit: 2012-01-06 | Discharge: 2012-01-06 | Disposition: A | Discharge status: Home / Self Care | Payer: Self-pay | Source: Ambulatory Visit | Attending: Family Medicine | Admitting: Family Medicine

## 2012-01-06 DIAGNOSIS — R102 Pelvic and perineal pain: Secondary | ICD-10-CM

## 2012-01-06 DIAGNOSIS — N281 Cyst of kidney, acquired: Secondary | ICD-10-CM | POA: Insufficient documentation

## 2012-01-06 DIAGNOSIS — N289 Disorder of kidney and ureter, unspecified: Secondary | ICD-10-CM | POA: Insufficient documentation

## 2012-01-06 DIAGNOSIS — R109 Unspecified abdominal pain: Secondary | ICD-10-CM | POA: Insufficient documentation

## 2012-01-06 NOTE — Telephone Encounter (Signed)
Message copied by Teddy Spike on Tue Jan 06, 2012  1:00 PM ------      Message from: Clovis Cao      Created: Tue Jan 06, 2012 12:37 PM      Regarding: call with results       Ultrasound showed empty bladder, but growth of her kidney cysts. Please keep appointment for follow up tomorrow with Dr. Mingo Amber. She may need an MRI or referral back to nephrology.

## 2012-01-06 NOTE — Telephone Encounter (Signed)
Pt returned call and message was given - she will be at appt w/ Mingo Amber

## 2012-01-06 NOTE — Telephone Encounter (Signed)
lmovm asking pt to call back. Pt to be informed of the following;  Growth of the cysts on her kidneys. She will need to keep the appt with Dr. Mingo Amber on tomorrow.Audelia Hives [place]

## 2012-01-07 ENCOUNTER — Ambulatory Visit (INDEPENDENT_AMBULATORY_CARE_PROVIDER_SITE_OTHER): Payer: Self-pay | Admitting: Family Medicine

## 2012-01-07 ENCOUNTER — Encounter: Payer: Self-pay | Admitting: Family Medicine

## 2012-01-07 VITALS — BP 145/69 | HR 77 | Temp 98.6°F | Ht 65.0 in | Wt 162.6 lb

## 2012-01-07 DIAGNOSIS — Q619 Cystic kidney disease, unspecified: Secondary | ICD-10-CM

## 2012-01-07 DIAGNOSIS — N19 Unspecified kidney failure: Secondary | ICD-10-CM

## 2012-01-07 DIAGNOSIS — R319 Hematuria, unspecified: Secondary | ICD-10-CM

## 2012-01-07 DIAGNOSIS — R109 Unspecified abdominal pain: Secondary | ICD-10-CM

## 2012-01-07 DIAGNOSIS — R799 Abnormal finding of blood chemistry, unspecified: Secondary | ICD-10-CM

## 2012-01-07 DIAGNOSIS — R102 Pelvic and perineal pain: Secondary | ICD-10-CM

## 2012-01-07 DIAGNOSIS — N281 Cyst of kidney, acquired: Secondary | ICD-10-CM

## 2012-01-07 DIAGNOSIS — R7989 Other specified abnormal findings of blood chemistry: Secondary | ICD-10-CM

## 2012-01-07 LAB — BASIC METABOLIC PANEL
BUN: 24 mg/dL — ABNORMAL HIGH (ref 6–23)
CO2: 27 mEq/L (ref 19–32)
Calcium: 10 mg/dL (ref 8.4–10.5)
Creat: 1.55 mg/dL — ABNORMAL HIGH (ref 0.50–1.10)
Glucose, Bld: 89 mg/dL (ref 70–99)

## 2012-01-07 LAB — POCT URINALYSIS DIPSTICK
Ketones, UA: NEGATIVE
Leukocytes, UA: NEGATIVE
Protein, UA: 100
Spec Grav, UA: 1.02
pH, UA: 6

## 2012-01-07 LAB — URINE CULTURE: Colony Count: NO GROWTH

## 2012-01-07 NOTE — Patient Instructions (Addendum)
I'd like to get you back in with the urologists in the next week or so. You did have some blood in your urine.   The ultrasound did show that your cysts have grown since the last ultrasound. We are going to repeat some of those tests today.    It was good to meet you!

## 2012-01-07 NOTE — Progress Notes (Deleted)
Subjective:     Patient ID: [name], female   DOB: [birth year], 56 y.o.   MRN: [id number]  HPI HPI  56 year old female with a history of CKD stage 3 presents presents to clinic after being seen 2 days ago.  Urinary symptoms oringinaly began 4 days ago.  Since being seen last, pt has had renal US showing enlargement of R renal cyst, U/A showing proteinuria and 1-5 granular cast, urine cultures with no growth to date, nml CBC, and CMET which was normal (*Cr = 1.64; this is unchanged in past year). Per pt, her urinary frequency is still present (voiding every 3-4 hrs).  Her feelings of "pressure" when she urinates have improved but are still present and she reports improvements in hesitancy while urinating.  There is no burning or pain with urination or changes in her urine and no visible blood. denies history of renal stone.   She has never smoked  Pt has had appendectomy   Endorsed family history of renal cysts (mother, cousin, maternal uncle)  Review of Systems Denies fever, chills, diarrhea, constipation, vomiting, hematuria, blood in urine, vaginal discharge/odor/itchying    Objective:   Physical Exam General: well appearing, no acute distress HEENT: EOM intact bilaterally, PERRL bilaterally, moist mucous membranes, throat nml Lungs: ????? Heart: ????? Abdomen: RLQ and Suprapubic TTP, soft, non-distended, no masses felt, bowel sounds present, no rebound tenderness MSK: -spine: TTP right Lumbar perispinal muscles, no CVA tenderness    Assessment:     ***    Plan:     ***

## 2012-01-08 ENCOUNTER — Telehealth: Payer: Self-pay | Admitting: Family Medicine

## 2012-01-08 DIAGNOSIS — N281 Cyst of kidney, acquired: Secondary | ICD-10-CM

## 2012-01-08 DIAGNOSIS — R319 Hematuria, unspecified: Secondary | ICD-10-CM | POA: Insufficient documentation

## 2012-01-08 HISTORY — DX: Hematuria, unspecified: R31.9

## 2012-01-08 HISTORY — DX: Cyst of kidney, acquired: N28.1

## 2012-01-08 NOTE — Assessment & Plan Note (Signed)
Persists.   Never smoker Possible nephrolithiasis, but she is comfortable.

## 2012-01-08 NOTE — Assessment & Plan Note (Signed)
Interval growth since last ultrasound in 2008.   Will follow through on urology referral recommended last visit.

## 2012-01-08 NOTE — Telephone Encounter (Signed)
Called and discussed lab results with patient. Persistent hematuria without any acute worsening of creatinine. Renal ultrasounds results discussed in clinic yesterday. Will follow through on referral. Patient expressed understanding.

## 2012-01-08 NOTE — Assessment & Plan Note (Addendum)
Persists but improving.  Unclear etiology.  No acute red flags.   Possible nephrolithiasis with persistent hematuria.  Patient comfortable with relief via Naproxen.  Recommended limiting Naproxen use and to use Tylenol for relief in face of CKD. Prior physician had recommended for urologist referral, however referral not made.   Will place today - she is prior patient of [facility].  See CKD above.  I provided the patient with explicit warnings and red flags that would prompt return to clinic or the ED.

## 2012-01-08 NOTE — Assessment & Plan Note (Signed)
No acute worsening.

## 2012-01-08 NOTE — Progress Notes (Signed)
Patient ID: [name], female   DOB: Aug 22, 1955, 56 y.o.   MRN: [id number] [name] is a 56 y.o. female who presents to [facility] today for follow-up from 2 days prior:  1.  Suprapubic pain:  Patient seen 2 days prior with complaints of suprapubic pain.  Symptoms started 4 days ago.  Had STAT labs and renal US performed.  Renal U/S showed enlargement of R renal cyst and persistent proteinuria and hematuria.  No change in CKD.    Still with complaints of pressure and urgency, improved with voiding.  Denies any overt dysuria or gross hematuria.    No fevers or chills, no weight loss, no night sweats.  She is s/p appendectomy many years prior.   The following portions of the patient's history were reviewed and updated as appropriate: allergies, current medications, past medical history, family and social history, and problem list.  Patient is a nonsmoker.  Past Medical History  Diagnosis Date  . RENAL INSUFFICIENCY, CHRONIC 06/18/2006    Qualifier: Diagnosis of  By: Darylene Price MD, Elta Guadeloupe    . OSTEOPENIA 06/18/2006    Qualifier: Diagnosis of  By: Darylene Price MD, Elta Guadeloupe    . MIGRAINE, UNSPEC., W/O INTRACTABLE MIGRAINE 06/18/2006    Qualifier: Diagnosis of  By: Darylene Price MD, Mark      ROS as above otherwise neg. No Chest pain, palpitations, SOB, Fever, Chills, Abd pain, N/V/D.  Medications reviewed. Current Outpatient Prescriptions  Medication Sig Dispense Refill  . calcium-vitamin D (OSCAL WITH D 500-200) 500-200 MG-UNIT per tablet Take 1 tablet by mouth 3 (three) times daily.        Marland Kitchen gabapentin (NEURONTIN) 100 MG capsule Take 1 capsule (100 mg total) by mouth 3 (three) times daily.  90 capsule  2  . HYDROcodone-acetaminophen (VICODIN) 5-500 MG per tablet Take 1 tablet by mouth every 4 (four) hours as needed for pain.  45 tablet  0  . naproxen (NAPROSYN) 500 MG tablet Take 1 tablet (500 mg total) by mouth 2 (two) times daily with a meal.  60 tablet  0  . prochlorperazine (COMPAZINE) 10 MG tablet Take 1  tablet (10 mg total) by mouth every 6 (six) hours as needed.  60 tablet  0    Exam:  BP 145/69  Pulse 77  Temp 98.6 F (37 C) (Oral)  Ht 5\' 5"  (1.651 m)  Wt 162 lb 9.6 oz (73.755 kg)  BMI 27.06 kg/m2 Gen: Well NAD HEENT: EOMI,  MMM Pulm:  Clear to auscultation bilaterally with good air movement.  No wheezes or rales noted.   Cardiac:  Regular rate and rhythm without murmur auscultated.  Good S1/S2. Abdomen:  Soft/ND.  Tender to palpation supra-pubically and RLQ.  Some mild CVA tenderness on Right, described as "dull, aching but not bad."  No guarding or rebound.   Exts: Non edematous BL  LE, warm and well perfused.   Results for orders placed in visit on 01/07/12 (from the past 72 hour(s))  POCT URINALYSIS DIPSTICK     Status: Abnormal   Collection Time   01/07/12 11:18 AM      Component Value Range Comment   Color, UA YELLOW      Clarity, UA CLEAR      Glucose, UA NEG      Bilirubin, UA NEG      Ketones, UA NEG      Spec Grav, UA 1.020      Blood, UA MODERATE      pH, UA  6.0      Protein, UA 100      Urobilinogen, UA 0.2      Nitrite, UA NEG      Leukocytes, UA Negative     BASIC METABOLIC PANEL     Status: Abnormal   Collection Time   01/07/12 11:18 AM      Component Value Range Comment   Sodium 139  135 - 145 mEq/L    Potassium 5.0  3.5 - 5.3 mEq/L    Chloride 105  96 - 112 mEq/L    CO2 27  19 - 32 mEq/L    Glucose, Bld 89  70 - 99 mg/dL    BUN 24 (*) 6 - 23 mg/dL    Creat 1.55 (*) 0.50 - 1.10 mg/dL    Calcium 10.0  8.4 - 10.5 mg/dL   POCT UA - MICROSCOPIC ONLY     Status: Abnormal   Collection Time   01/07/12 11:18 AM      Component Value Range Comment   WBC, Ur, HPF, POC RARE      RBC, urine, microscopic >20      Bacteria, U Microscopic TRACE      Epithelial cells, urine per micros RARE

## 2012-01-12 ENCOUNTER — Ambulatory Visit (HOSPITAL_COMMUNITY)
Admission: RE | Admit: 2012-01-12 | Discharge: 2012-01-12 | Disposition: A | Discharge status: Home / Self Care | Payer: Self-pay | Source: Ambulatory Visit | Attending: Family Medicine | Admitting: Family Medicine

## 2012-01-12 DIAGNOSIS — Z1231 Encounter for screening mammogram for malignant neoplasm of breast: Secondary | ICD-10-CM | POA: Insufficient documentation

## 2012-02-12 ENCOUNTER — Telehealth: Payer: Self-pay | Admitting: Family Medicine

## 2012-02-12 DIAGNOSIS — N281 Cyst of kidney, acquired: Secondary | ICD-10-CM

## 2012-02-12 NOTE — Telephone Encounter (Signed)
Received a fax from [facility] requesting MRI abdomen with and without contrast.  After obtaining MRI they will schedule patient.  Will place order today.  She has had increasing size of renal cyst based on interval U/S studies.

## 2012-02-12 NOTE — Telephone Encounter (Signed)
MRI scheduled 02/17/2012 @ 1pm at [facility].  Ms. Bentancourt notified.  Lauralyn Primes

## 2012-02-16 ENCOUNTER — Ambulatory Visit (HOSPITAL_COMMUNITY)
Admission: RE | Admit: 2012-02-16 | Discharge: 2012-02-16 | Disposition: A | Discharge status: Home / Self Care | Payer: Self-pay | Source: Ambulatory Visit | Attending: Family Medicine | Admitting: Family Medicine

## 2012-02-16 DIAGNOSIS — Q619 Cystic kidney disease, unspecified: Secondary | ICD-10-CM | POA: Insufficient documentation

## 2012-02-16 DIAGNOSIS — N281 Cyst of kidney, acquired: Secondary | ICD-10-CM

## 2012-02-16 DIAGNOSIS — K7689 Other specified diseases of liver: Secondary | ICD-10-CM | POA: Insufficient documentation

## 2012-02-16 MED ORDER — GADOBENATE DIMEGLUMINE 529 MG/ML IV SOLN: 8.0000 mL | Freq: Once | INTRAVENOUS | Status: AC | PRN

## 2012-02-17 ENCOUNTER — Ambulatory Visit (HOSPITAL_COMMUNITY): Payer: Self-pay

## 2012-03-17 ENCOUNTER — Encounter: Payer: Self-pay | Admitting: Family Medicine

## 2012-03-17 ENCOUNTER — Other Ambulatory Visit (HOSPITAL_COMMUNITY)
Admission: RE | Admit: 2012-03-17 | Discharge: 2012-03-17 | Disposition: A | Discharge status: Home / Self Care | Payer: Self-pay | Source: Ambulatory Visit | Attending: Family Medicine | Admitting: Family Medicine

## 2012-03-17 ENCOUNTER — Ambulatory Visit (INDEPENDENT_AMBULATORY_CARE_PROVIDER_SITE_OTHER): Payer: Self-pay | Admitting: Family Medicine

## 2012-03-17 VITALS — BP 122/80 | HR 94 | Temp 98.7°F | Ht 65.0 in | Wt 164.0 lb

## 2012-03-17 DIAGNOSIS — Z0000 Encounter for general adult medical examination without abnormal findings: Secondary | ICD-10-CM

## 2012-03-17 DIAGNOSIS — Z124 Encounter for screening for malignant neoplasm of cervix: Secondary | ICD-10-CM

## 2012-03-17 DIAGNOSIS — Z01419 Encounter for gynecological examination (general) (routine) without abnormal findings: Secondary | ICD-10-CM | POA: Insufficient documentation

## 2012-03-17 DIAGNOSIS — R252 Cramp and spasm: Secondary | ICD-10-CM

## 2012-03-17 DIAGNOSIS — Z23 Encounter for immunization: Secondary | ICD-10-CM

## 2012-03-17 DIAGNOSIS — N19 Unspecified kidney failure: Secondary | ICD-10-CM

## 2012-03-17 DIAGNOSIS — Z1151 Encounter for screening for human papillomavirus (HPV): Secondary | ICD-10-CM | POA: Insufficient documentation

## 2012-03-17 DIAGNOSIS — Z13220 Encounter for screening for lipoid disorders: Secondary | ICD-10-CM

## 2012-03-17 HISTORY — DX: Encounter for general adult medical examination without abnormal findings: Z00.00

## 2012-03-17 LAB — CBC WITH DIFFERENTIAL/PLATELET
Basophils Absolute: 0.1 10*3/uL (ref 0.0–0.1)
Basophils Relative: 1 % (ref 0–1)
Eosinophils Absolute: 0.1 10*3/uL (ref 0.0–0.7)
Eosinophils Relative: 1 % (ref 0–5)
HCT: 39.1 % (ref 36.0–46.0)
MCH: 28.3 pg (ref 26.0–34.0)
MCHC: 33.8 g/dL (ref 30.0–36.0)
Monocytes Absolute: 0.6 10*3/uL (ref 0.1–1.0)
Monocytes Relative: 8 % (ref 3–12)
Neutro Abs: 3.8 10*3/uL (ref 1.7–7.7)
RDW: 13.9 % (ref 11.5–15.5)

## 2012-03-17 LAB — LIPID PANEL
Cholesterol: 227 mg/dL — ABNORMAL HIGH (ref 0–200)
HDL: 74 mg/dL (ref >39)
LDL Cholesterol: 129 mg/dL — ABNORMAL HIGH (ref 0–99)
Triglycerides: 120 mg/dL (ref <150)

## 2012-03-17 LAB — COMPREHENSIVE METABOLIC PANEL
Albumin: 4.3 g/dL (ref 3.5–5.2)
Alkaline Phosphatase: 68 U/L (ref 39–117)
BUN: 31 mg/dL — ABNORMAL HIGH (ref 6–23)
CO2: 28 mEq/L (ref 19–32)
Glucose, Bld: 96 mg/dL (ref 70–99)
Potassium: 4.9 mEq/L (ref 3.5–5.3)
Total Bilirubin: 0.4 mg/dL (ref 0.3–1.2)

## 2012-03-17 NOTE — Patient Instructions (Addendum)
Keep urology appointment  See info on stool cards  Will get fasting labwork today  Make follow-up appointment with your primary doctor for other concerns

## 2012-03-17 NOTE — Progress Notes (Signed)
  Subjective:    Patient ID: [name], female    DOB: 04/11/1956, 56 y.o.   MRN: [id number]  HPI  Annual Gynecological Exam   Wt Readings from Last 3 Encounters:  03/17/12 164 lb (74.39 kg)  01/07/12 162 lb 9.6 oz (73.755 kg)  01/05/12 157 lb (71.215 kg)   Last period:  menopausal Regular periods: yes Heavy bleeding: no  Sexually active: no Birth control or hormonal therapy: post menopausal Hx of STD: Patient desires STD screening- had exposure to skin contact with unwanted ex.  No penetration.  Had a bump- tender but not painful.  Had HIVand std screening at health dept Dyspareunia: No- not active Hot flashes: No Vaginal discharge: no Dysuria: No  Last mammogram: had mammogram Breast mass or concerns: No Last Pap: 11/2007 History of abnormal pap: No  FH of breast, uterine, ovarian, colon cancer: No   reviewing chart- patient is undergoing urologic workup for hematuria and renal cyst.  had MRI requested by urology.  They were not able to contact patient for appointmentReview of Systems    see HPI Objective:   Physical Exam GEN: Alert & Oriented, No acute distress CV:  Regular Rate & Rhythm, no murmur Respiratory:  Normal work of breathing, CTAB Abd:  + BS, soft, no tenderness to palpation Ext: no pre-tibial edema Pelvic Exam:        External: normal female genitalia without lesions or masses        Vagina: normal without lesions or masses        Cervix: normal without lesions or masses        Adnexa: normal bimanual exam without masses or fullness        Uterus: normal by palpation        Pap smear: performed        Breast: wnl       Assessment & Plan:

## 2012-03-17 NOTE — Assessment & Plan Note (Signed)
Discussed screening recommendation, obtained fasting labwork, pap performed today.  Follow-up with primary doctor.

## 2012-03-17 NOTE — Assessment & Plan Note (Signed)
Will check bmet today.  Assisted patient in making urology appointment for further workup.

## 2012-03-24 ENCOUNTER — Encounter: Payer: Self-pay | Admitting: Family Medicine

## 2012-04-12 ENCOUNTER — Ambulatory Visit (INDEPENDENT_AMBULATORY_CARE_PROVIDER_SITE_OTHER): Payer: No Typology Code available for payment source | Admitting: Family Medicine

## 2012-04-12 ENCOUNTER — Encounter: Payer: Self-pay | Admitting: Family Medicine

## 2012-04-12 ENCOUNTER — Ambulatory Visit (HOSPITAL_COMMUNITY)
Admission: RE | Admit: 2012-04-12 | Discharge: 2012-04-12 | Disposition: A | Discharge status: Home / Self Care | Payer: No Typology Code available for payment source | Source: Ambulatory Visit | Attending: Family Medicine | Admitting: Family Medicine

## 2012-04-12 VITALS — BP 137/83 | HR 89 | Temp 99.1°F | Wt 164.5 lb

## 2012-04-12 DIAGNOSIS — R002 Palpitations: Secondary | ICD-10-CM | POA: Insufficient documentation

## 2012-04-12 DIAGNOSIS — M542 Cervicalgia: Secondary | ICD-10-CM

## 2012-04-12 DIAGNOSIS — E042 Nontoxic multinodular goiter: Secondary | ICD-10-CM

## 2012-04-12 NOTE — Patient Instructions (Addendum)
Your EKG looks normal  Will check labwork test for a sign of inflammation or infection  Will order ultrasound for your neck/thyroid  If you have worsening symptoms such as fever, worsening pain, or chest pain, please seek medical care.

## 2012-04-12 NOTE — Progress Notes (Signed)
  Subjective:    Patient ID: [name], female    DOB: June 11, 1955, 56 y.o.   MRN: [id number]  HPIHere for workup of jaw pain  Had increase in chronic "fluttering in my chest" about 4-5 days ago, states it stopped, then pain seemed to go to her neck and to her right jaw.  Fluttering and jaw pain is gone, but feels soreness in right neck and feels like her gums are swollen.  Brought it up to her dentist last month, had normal xrays.  Notes gum pain comes and goes.  Also had had history of right neck pain, reviewed [place] from 2012 showing nodules, found on ultrasound for same symptoms.  No trouble breathing, neck is sore when she swallows.  States she has not energy, overall fatigued.  Sleeping well at night.  PMH Notes previous workup for her fluttering included a treadmill stress test which was normal.  Notes has had fluttering every other week at times.  Drinks caffeine- mountain dew makes fluttering act up.  Labwork done 3 weeks ago shows normal CBC, CMET, TSH except for chronic stable Cr of 1.51  Review of Systems See HPI    Objective:   Physical Exam GEN: Alert & Oriented, No acute distress HEENT: [place]/AT. EOMI, PERRLA, no conjunctival injection or scleral icterus.  Bilateral tympanic membranes intact without erythema or effusion.  .  Nares without edema or rhinorrhea.  Oropharynx is without erythema or exudates.  No anterior or posterior cervical lymphadenopathy.  Pain on palpation of right side under mandible, and on right lateral neck.  No swelling on thyromegaly/nodules.  No temporal artery tenderness but notes right sided headache.  Mouth shows a prominence of upper gums bilaterally, but left side appears more red- not painful on palpation- patient reports dental exam recently as normal. CV:  Regular Rate & Rhythm, no murmur Respiratory:  Normal work of breathing, CTAB Abd:  + BS, soft, no tenderness to palpation Ext: no pre-tibial edema        Assessment & Plan:

## 2012-04-12 NOTE — Assessment & Plan Note (Signed)
EKG today normal.  Reports history of normal exercise stress test.  Non exertional, no chest pain.  Some component of anxiety.  Recently had an uncle die of heart diease.    Advised given symptoms very rare, monitoring would not be likley to be useful at this time.  Advised if continued to be frequent or has symtpoms such as chest pain, dyspnea, or on exertion, to return for further workup.

## 2012-04-12 NOTE — Assessment & Plan Note (Addendum)
Unclear etiology- patient connects jaw pain and palpitations to same etiology but do not seems connected and all seem to be chronic and unrelated.  Will check US neck as she is overdue for follow-up of thyroid nodules.  Tsh just checked 3 weeks ago and was normal.  WIll check ESR.  Update:  ESR with mild elevation based on age range- today at 18 vs 32.5 as upper limit.  Nonspecific, symptoms + sed rate not highly concerning for Temporal arteritis.  Will continue to monitor symptoms, will discuss with patient once ultrasound results return.  Update 12/26, still mild neck and jaw pain.  No claudication.  Will go forward with workup of thyroid nodules r>L

## 2012-04-15 ENCOUNTER — Ambulatory Visit (HOSPITAL_COMMUNITY)
Admission: RE | Admit: 2012-04-15 | Discharge: 2012-04-15 | Disposition: A | Discharge status: Home / Self Care | Payer: No Typology Code available for payment source | Source: Ambulatory Visit | Attending: Family Medicine | Admitting: Family Medicine

## 2012-04-15 DIAGNOSIS — E049 Nontoxic goiter, unspecified: Secondary | ICD-10-CM | POA: Insufficient documentation

## 2012-04-15 DIAGNOSIS — E042 Nontoxic multinodular goiter: Secondary | ICD-10-CM | POA: Insufficient documentation

## 2012-04-15 DIAGNOSIS — M542 Cervicalgia: Secondary | ICD-10-CM | POA: Insufficient documentation

## 2012-04-15 HISTORY — DX: Nontoxic multinodular goiter: E04.2

## 2012-04-15 NOTE — Addendum Note (Signed)
Addended by: Katherina Mires on: 04/15/2012 12:04 PM   Modules accepted: Orders

## 2012-04-15 NOTE — Assessment & Plan Note (Signed)
Right > L with enlargement of right solid and cystic nodule 20 x 14 x 20 mm.  Will refer to gen surg for eval for FNA

## 2012-04-27 ENCOUNTER — Encounter (INDEPENDENT_AMBULATORY_CARE_PROVIDER_SITE_OTHER): Payer: Self-pay | Admitting: General Surgery

## 2012-04-29 ENCOUNTER — Other Ambulatory Visit (INDEPENDENT_AMBULATORY_CARE_PROVIDER_SITE_OTHER): Payer: Self-pay | Admitting: General Surgery

## 2012-04-29 ENCOUNTER — Telehealth (INDEPENDENT_AMBULATORY_CARE_PROVIDER_SITE_OTHER): Payer: Self-pay | Admitting: General Surgery

## 2012-04-29 ENCOUNTER — Ambulatory Visit (INDEPENDENT_AMBULATORY_CARE_PROVIDER_SITE_OTHER): Payer: PRIVATE HEALTH INSURANCE | Admitting: General Surgery

## 2012-04-29 ENCOUNTER — Encounter (INDEPENDENT_AMBULATORY_CARE_PROVIDER_SITE_OTHER): Payer: Self-pay | Admitting: General Surgery

## 2012-04-29 VITALS — BP 118/64 | HR 70 | Temp 98.5°F | Resp 12 | Ht 65.0 in | Wt 165.8 lb

## 2012-04-29 DIAGNOSIS — E041 Nontoxic single thyroid nodule: Secondary | ICD-10-CM

## 2012-04-29 DIAGNOSIS — E042 Nontoxic multinodular goiter: Secondary | ICD-10-CM

## 2012-04-29 DIAGNOSIS — R6884 Jaw pain: Secondary | ICD-10-CM

## 2012-04-29 NOTE — Progress Notes (Signed)
Subjective:     Patient ID: [name], female   DOB: [birth year], 56 y.o.   MRN: [id number]  HPI The patient is a 57 year old female who was referred by Dr. Doreene Nest. Patient states she's had pain and swelling to her right mandibular area. She states she also has pain in her right SCM and/peritracheal area.  She states the pain is on and off Ttimes has no pain or difficulty with swallowing for months at a time, and then returned.  Patient's had an ultrasound of her thyroid to followup nodules which really small increase in size. She's had thyroid function tests which were normal. She describes no dyspnea or dysphagia with liquids and solids. She does notice a pressure sensation the right side of her neck when she lays on that side at night. Her biggest complaint is of pain she has a right upper mandibular area/TMJ, which states was symptomatic prior to her neck pain.  Review of Systems  Constitutional: Negative.   HENT: Positive for facial swelling (R side) and neck pain (to include her R mandible, TMJ,  and SCM area).   Cardiovascular: Negative.   Gastrointestinal: Negative.        Objective:   Physical Exam  Constitutional: She appears well-developed and well-nourished.  HENT:  Head: Normocephalic and atraumatic.  Eyes: EOM are normal. Pupils are equal, round, and reactive to light.  Neck: Normal range of motion. Neck supple. No mass and no thyromegaly present.  Cardiovascular: Normal rate and normal heart sounds.   Pulmonary/Chest: Effort normal and breath sounds normal.  Abdominal: Soft. Bowel sounds are normal.  Musculoskeletal: Normal range of motion.       Assessment:     57 year old female with thyroid nodules, right sided face pain and swelling. The patient has no thyromegaly.    Plan:     1. We will have the patient had a right thyroid nodules biopsied to rule out any carcinoma.  She'll follow up with [place] after biopsy to discuss the results.  2. We'll refer the patient  to oral maxillofacial surgeon to evaluate her TMJ and any possible intervention for the pain she has on that side of her face.

## 2012-04-29 NOTE — Telephone Encounter (Signed)
LMOM for patient to call Dr Goal office and ask for [facility] about her apt they need her to pay up front with a deposit of 100 dollars. I told patient to call at [phone]

## 2012-05-04 ENCOUNTER — Other Ambulatory Visit: Payer: No Typology Code available for payment source

## 2012-05-11 ENCOUNTER — Ambulatory Visit
Admission: RE | Admit: 2012-05-11 | Discharge: 2012-05-11 | Disposition: A | Discharge status: Home / Self Care | Payer: No Typology Code available for payment source | Source: Ambulatory Visit | Attending: General Surgery | Admitting: General Surgery

## 2012-05-11 ENCOUNTER — Other Ambulatory Visit (HOSPITAL_COMMUNITY)
Admission: RE | Admit: 2012-05-11 | Discharge: 2012-05-11 | Disposition: A | Discharge status: Home / Self Care | Payer: No Typology Code available for payment source | Source: Ambulatory Visit | Attending: Interventional Radiology | Admitting: Interventional Radiology

## 2012-05-11 ENCOUNTER — Inpatient Hospital Stay
Admission: RE | Admit: 2012-05-11 | Discharge: 2012-05-11 | Payer: No Typology Code available for payment source | Source: Ambulatory Visit | Attending: General Surgery | Admitting: General Surgery

## 2012-05-11 DIAGNOSIS — E041 Nontoxic single thyroid nodule: Secondary | ICD-10-CM | POA: Insufficient documentation

## 2012-05-13 ENCOUNTER — Encounter (INDEPENDENT_AMBULATORY_CARE_PROVIDER_SITE_OTHER): Payer: Self-pay | Admitting: General Surgery

## 2012-05-13 ENCOUNTER — Ambulatory Visit (INDEPENDENT_AMBULATORY_CARE_PROVIDER_SITE_OTHER): Payer: PRIVATE HEALTH INSURANCE | Admitting: General Surgery

## 2012-05-13 VITALS — BP 130/80 | HR 78 | Resp 16 | Ht 65.0 in | Wt 166.0 lb

## 2012-05-13 DIAGNOSIS — E049 Nontoxic goiter, unspecified: Secondary | ICD-10-CM

## 2012-05-13 NOTE — Progress Notes (Signed)
Patient ID: [name], female   DOB: 02-Mar-1956, 57 y.o.   MRN: [id number] The patient is a 57 year old female [place] referred for right neck and TMJ pain. He states that the pain came to resolve she underwent biopsy of her right thyroid nodule which revealed a non-cancerous quarter. The patient currently having no symptoms as far as dysphagia or dyspnea. At this point there is no need for surgery, patient followup with her PCP as far as imaging the nodules every 6-12 months. We scheduled the patient having symptomatology from her thyroid we can have the patient back to discuss thyroidectomy.  The patient followup when necessary The patient followup with her PCP regarding imaging to follow of her nodules. Patient is to follow up for TMJ.

## 2012-12-09 ENCOUNTER — Ambulatory Visit: Payer: No Typology Code available for payment source

## 2012-12-22 ENCOUNTER — Ambulatory Visit (INDEPENDENT_AMBULATORY_CARE_PROVIDER_SITE_OTHER): Payer: No Typology Code available for payment source | Admitting: Family Medicine

## 2012-12-22 VITALS — BP 129/68 | HR 88 | Temp 98.4°F | Ht 65.0 in | Wt 165.0 lb

## 2012-12-22 DIAGNOSIS — R609 Edema, unspecified: Secondary | ICD-10-CM

## 2012-12-22 DIAGNOSIS — IMO0002 Reserved for concepts with insufficient information to code with codable children: Secondary | ICD-10-CM

## 2012-12-22 DIAGNOSIS — R3915 Urgency of urination: Secondary | ICD-10-CM

## 2012-12-22 DIAGNOSIS — R319 Hematuria, unspecified: Secondary | ICD-10-CM

## 2012-12-22 HISTORY — DX: Edema, unspecified: R60.9

## 2012-12-22 LAB — COMPREHENSIVE METABOLIC PANEL
ALT: 11 U/L (ref 0–35)
AST: 17 U/L (ref 0–37)
CO2: 27 mEq/L (ref 19–32)
Chloride: 103 mEq/L (ref 96–112)
Sodium: 137 mEq/L (ref 135–145)
Total Bilirubin: 0.3 mg/dL (ref 0.3–1.2)
Total Protein: 6.9 g/dL (ref 6.0–8.3)

## 2012-12-22 LAB — POCT UA - MICROSCOPIC ONLY: RBC, urine, microscopic: >20

## 2012-12-22 LAB — POCT URINALYSIS DIPSTICK
Leukocytes, UA: NEGATIVE
Protein, UA: 100
Urobilinogen, UA: 0.2

## 2012-12-22 MED ORDER — HYDROCODONE-ACETAMINOPHEN 5-325 MG PO TABS: 1.0000 | ORAL_TABLET | Freq: Four times a day (QID) | ORAL | Status: DC | PRN

## 2012-12-22 NOTE — Assessment & Plan Note (Signed)
Acute on chronic back pain. Will treat with Vicodin and rest. F/u in 2-5 days to see if she is improving. Consider re-imaging her back given new symptoms.

## 2012-12-22 NOTE — Assessment & Plan Note (Signed)
New problem for her. Unsure of etiology. Will check CMET. Encouraged to keep her legs elevated and follow up soon.

## 2012-12-22 NOTE — Progress Notes (Signed)
Patient ID: [name], female   DOB: [birth year], 57 y.o.   MRN: [id number]  [name] [name] M. Morgan Lippard, MD Phone: [phone]   Subjective: HPI: Patient is a 57 y.o. female presenting to [facility] today for same day appointment. Concerns today include back pain, edema, and urinary frequency.  1. Back pain- Has history of back pain, injured back in 2001. Same pain she has been having but now has pain in left leg and right hip, which is new. Hip pain and leg pain started 3 days ago and getting slightly better. She was unable to walk or rollover in bed 3 days ago. She has tried Tylenol which does not help. She has been on Vicodin in the past which has helped some. Some numbness in toes. No incontinence.   2. Edema- Swelling of feet and legs x3 days. Aching and heavy. Never had leg swelling before. Swelling goes down when she puts her feet up at night.   3. Urinary frequency- No dysuria, no odor. No flank pain. No fevers. Does have some suprapubic pain. No frank hematuria.  History Reviewed: Never smoker.   ROS: Please see HPI above.  Objective: Office vital signs reviewed. BP 129/68  Pulse 88  Temp(Src) 98.4 F (36.9 C) (Oral)  Ht 5\' 5"  (1.651 m)  Wt 165 lb (74.844 kg)  BMI 27.46 kg/m2  Physical Examination:  General: Awake, alert. NAD. Very pleasant HEENT: Atraumatic, normocephalic. MMM Pulm: CTAB, no wheezes Cardio: RRR, no murmurs appreciated Abdomen:+BS, soft, nontender, nondistended. No CVA tenderness Back: TTP lumbar spine with tenderness of paraspinal muscles. TTP of greater trochanter of right hip. Neg straight leg raise.  Extremities: No edema Neuro: Grossly intact. Sensation intact, good distal pulses. Diminished but equal reflexes of patellar and achilles bilaterally.   Assessment: 57 y.o. female same day  Plan: See Problem List and After Visit Summary

## 2012-12-22 NOTE — Assessment & Plan Note (Signed)
Past history of hematuria, present again today. Will send for culture. Consider referral to Urology (at [facility]) for better evaluation of cause. Patient agrees.

## 2012-12-22 NOTE — Patient Instructions (Addendum)
It was nice to meet you today. I am sorry you have so much going on.  Try to elevate your legs as much as you can. Try to rest and see if your back pain gets better. I will call you if your labs are abnormal.  Please come back for a check up on Friday or Monday.  Fields Oros M. Emmylou Bieker, M.D.

## 2012-12-24 LAB — URINE CULTURE
Colony Count: NO GROWTH
Organism ID, Bacteria: NO GROWTH

## 2012-12-29 ENCOUNTER — Ambulatory Visit (INDEPENDENT_AMBULATORY_CARE_PROVIDER_SITE_OTHER): Payer: No Typology Code available for payment source | Admitting: Family Medicine

## 2012-12-29 ENCOUNTER — Encounter: Payer: Self-pay | Admitting: Family Medicine

## 2012-12-29 VITALS — BP 130/82 | HR 82 | Temp 98.6°F | Ht 65.0 in | Wt 167.0 lb

## 2012-12-29 DIAGNOSIS — R799 Abnormal finding of blood chemistry, unspecified: Secondary | ICD-10-CM

## 2012-12-29 DIAGNOSIS — R7989 Other specified abnormal findings of blood chemistry: Secondary | ICD-10-CM

## 2012-12-29 DIAGNOSIS — R319 Hematuria, unspecified: Secondary | ICD-10-CM

## 2012-12-29 NOTE — Patient Instructions (Addendum)
It was good to see you . I am very sorry about your wait today.   We will refer you to the kidney doctor. Try to avoid Aspirin, Advil and Aleve.  Try over-the-counter compression socks for your swelling.  Follow up in 1 month.  Annalaura Sauseda M. Halee Glynn, M.D.

## 2012-12-30 NOTE — Assessment & Plan Note (Signed)
Urine culture negative but elevated Creatinine. Will refer back to [facility] for further work up. Patient agrees. She is to stay well hydrated, but not drink TOO much. She should also avoid NSAID. Return to PCP for regular visit, or sooner if she has any problems.

## 2012-12-30 NOTE — Progress Notes (Signed)
Patient ID: [name], female   DOB: 11/11/1955, 57 y.o.   MRN: [id number]  [facility] Kobi Aller M. Natallia Stellmach, MD Phone: [phone]   Subjective: HPI: Patient is a 57 y.o. female presenting to clinic today for follow up appointment for leg edema and back pain.  Patient was seen as same day last week for new onset lower extremity edema. We checked labs at that time which showed increased Creatinine from her previous baseline as well as hematuria. She states she has had both of these problems before. 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 Her siblings and children have the same. She is concerned that her renal function is getting worse and that this may be contributing to her swelling. Otherwise, she feels well.   History Reviewed: Non smoker.  ROS: Please see HPI above.  Objective: Office vital signs reviewed. BP 130/82  Pulse 82  Temp(Src) 98.6 F (37 C) (Oral)  Ht 5\' 5"  (1.651 m)  Wt 167 lb (75.751 kg)  BMI 27.79 kg/m2  Physical Examination:  General: Awake, alert. NAD HEENT: Atraumatic, normocephalic Neck: No masses palpated. No LAD Pulm: CTAB, no wheezes Cardio: RRR, no murmurs appreciated Abdomen:+BS, soft, nontender, nondistended Extremities: Trace edema of feet Neuro: Grossly intact  Assessment: 57 y.o. female follow up  Plan: See Problem List and After Visit Summary

## 2013-01-27 ENCOUNTER — Ambulatory Visit: Payer: No Typology Code available for payment source | Admitting: Family Medicine

## 2013-05-16 ENCOUNTER — Telehealth: Payer: Self-pay | Admitting: *Deleted

## 2013-05-16 NOTE — Telephone Encounter (Signed)
Pt called complaining of chest pain, SOB and dizziness x 3 day with some relief with one Tylenol tab.  Pt stated that the pain comes and goes.  Pt also stated she has a headache on back of head and above left eye area, and frequent urination every 40 minutes.  Pt denies any numbness or tingling in extremities or blurred vision.  Denies in burning or pain with urination.  BP taken at home 05/15/2013 154/99 and 150/103, currently not taking medication for high blood pressure.  Has history of elevated creatine levels, did not follow up with nephrologist. Precepted with Dr. Erin Hearing, advised pt to go to ED, Urgent Care or schedule appt for tomorrow.  Pt  Opt to schedule appt for tomorrow 05/17/2013 at 10:15 AM.  Derl Barrow, RN

## 2013-05-17 ENCOUNTER — Ambulatory Visit (HOSPITAL_COMMUNITY)
Admission: RE | Admit: 2013-05-17 | Discharge: 2013-05-17 | Disposition: A | Discharge status: Home / Self Care | Payer: BC Managed Care – PPO | Source: Ambulatory Visit | Attending: Family Medicine | Admitting: Family Medicine

## 2013-05-17 ENCOUNTER — Encounter: Payer: Self-pay | Admitting: Family Medicine

## 2013-05-17 ENCOUNTER — Ambulatory Visit (INDEPENDENT_AMBULATORY_CARE_PROVIDER_SITE_OTHER): Payer: BC Managed Care – PPO | Admitting: Family Medicine

## 2013-05-17 VITALS — BP 142/89 | HR 76 | Temp 99.2°F | Ht 65.0 in | Wt 171.1 lb

## 2013-05-17 DIAGNOSIS — R079 Chest pain, unspecified: Secondary | ICD-10-CM | POA: Insufficient documentation

## 2013-05-17 DIAGNOSIS — N281 Cyst of kidney, acquired: Secondary | ICD-10-CM

## 2013-05-17 DIAGNOSIS — R319 Hematuria, unspecified: Secondary | ICD-10-CM

## 2013-05-17 DIAGNOSIS — R0600 Dyspnea, unspecified: Secondary | ICD-10-CM

## 2013-05-17 DIAGNOSIS — Q619 Cystic kidney disease, unspecified: Secondary | ICD-10-CM

## 2013-05-17 DIAGNOSIS — K219 Gastro-esophageal reflux disease without esophagitis: Secondary | ICD-10-CM

## 2013-05-17 DIAGNOSIS — R002 Palpitations: Secondary | ICD-10-CM

## 2013-05-17 DIAGNOSIS — R0609 Other forms of dyspnea: Secondary | ICD-10-CM

## 2013-05-17 DIAGNOSIS — R3589 Other polyuria: Secondary | ICD-10-CM

## 2013-05-17 DIAGNOSIS — I10 Essential (primary) hypertension: Secondary | ICD-10-CM

## 2013-05-17 DIAGNOSIS — R358 Other polyuria: Secondary | ICD-10-CM

## 2013-05-17 DIAGNOSIS — R0989 Other specified symptoms and signs involving the circulatory and respiratory systems: Secondary | ICD-10-CM

## 2013-05-17 LAB — POCT URINALYSIS DIPSTICK
BILIRUBIN UA: NEGATIVE
GLUCOSE UA: NEGATIVE
Ketones, UA: NEGATIVE
Leukocytes, UA: NEGATIVE
NITRITE UA: NEGATIVE
Protein, UA: 100
SPEC GRAV UA: 1.02
UROBILINOGEN UA: 0.2
pH, UA: 5.5

## 2013-05-17 LAB — POCT UA - MICROSCOPIC ONLY

## 2013-05-17 LAB — CBC WITH DIFFERENTIAL/PLATELET
Basophils Absolute: 0.1 10*3/uL (ref 0.0–0.1)
Basophils Relative: 1 % (ref 0–1)
EOS ABS: 0.1 10*3/uL (ref 0.0–0.7)
EOS PCT: 1 % (ref 0–5)
HEMATOCRIT: 38 % (ref 36.0–46.0)
HEMOGLOBIN: 12.7 g/dL (ref 12.0–15.0)
LYMPHS ABS: 2.5 10*3/uL (ref 0.7–4.0)
Lymphocytes Relative: 37 % (ref 12–46)
MCH: 27.7 pg (ref 26.0–34.0)
MCHC: 33.4 g/dL (ref 30.0–36.0)
MCV: 83 fL (ref 78.0–100.0)
MONO ABS: 0.6 10*3/uL (ref 0.1–1.0)
MONOS PCT: 9 % (ref 3–12)
NEUTROS PCT: 52 % (ref 43–77)
Neutro Abs: 3.5 10*3/uL (ref 1.7–7.7)
Platelets: 382 10*3/uL (ref 150–400)
RBC: 4.58 MIL/uL (ref 3.87–5.11)
RDW: 14.1 % (ref 11.5–15.5)
WBC: 6.8 10*3/uL (ref 4.0–10.5)

## 2013-05-17 LAB — COMPREHENSIVE METABOLIC PANEL
ALK PHOS: 52 U/L (ref 39–117)
ALT: 10 U/L (ref 0–35)
AST: 14 U/L (ref 0–37)
Albumin: 4.1 g/dL (ref 3.5–5.2)
BILIRUBIN TOTAL: 0.4 mg/dL (ref 0.3–1.2)
BUN: 21 mg/dL (ref 6–23)
CO2: 27 mEq/L (ref 19–32)
CREATININE: 1.29 mg/dL — AB (ref 0.50–1.10)
Calcium: 10 mg/dL (ref 8.4–10.5)
Chloride: 103 mEq/L (ref 96–112)
GLUCOSE: 84 mg/dL (ref 70–99)
Potassium: 4.3 mEq/L (ref 3.5–5.3)
Sodium: 136 mEq/L (ref 135–145)
Total Protein: 7.1 g/dL (ref 6.0–8.3)

## 2013-05-17 NOTE — Patient Instructions (Signed)
You have several issues going on:  Chest pain:  - You have soreness on your rib.  This is called costochondritis.  Continue to take Tylenol for this.  - I will refer you to see a cardiologist to discuss your other chest pain and your palpitations - Your EKG today was normal  Lab tests: - I will get some tests of your kidneys and potassium.  - I will call with those results tomorrow.  If the pain becomes bad or does not go away with rest, you need to come back or go to the ED after hours.

## 2013-05-17 NOTE — Progress Notes (Signed)
Subjective:    [name] is a 59 y.o. female who presents to [facility] today for chest pain:  1.  Chest Pain:  Present for past several weeks.  Doesn't know if affected by exertion, notices mostly at night after meals when lying flat.  However, she DOES have dyspnea with moderate to minimal exertion.  Has to stop and rest when shopping at grocery store.  No LE edema.    Has had some palpitations, worse in evening.  No pain during this.  Sometimes accompanied by short-term dyspnea.    No chest pain currently.    Of note, has history of AKI and renal cyst.  Hematuria on all UA's since 2012.  Has not followed up with either urology or [place] since 2013.    ROS as above per HPI, otherwise neg.  Pertinently, no chest pain, palpitations, SOB, Fever, Chills, Abd pain, N/V/D.   The following portions of the patient's history were reviewed and updated as appropriate: allergies, current medications, past medical history, family and social history, and problem list. Patient is a nonsmoker.    PMH reviewed.  Past Medical History  Diagnosis Date  . RENAL INSUFFICIENCY, CHRONIC 06/18/2006    Qualifier: Diagnosis of  By: Darylene Price MD, Elta Guadeloupe    . OSTEOPENIA 06/18/2006    Qualifier: Diagnosis of  By: Darylene Price MD, Elta Guadeloupe    . MIGRAINE, UNSPEC., W/O INTRACTABLE MIGRAINE 06/18/2006    Qualifier: Diagnosis of  By: Darylene Price MD, Elta Guadeloupe     Past Surgical History  Procedure Laterality Date  . Appendectomy      Medications reviewed. Current Outpatient Prescriptions  Medication Sig Dispense Refill  . HYDROcodone-acetaminophen (NORCO/VICODIN) 5-325 MG per tablet Take 1 tablet by mouth every 6 (six) hours as needed for pain.  20 tablet  0   No current facility-administered medications for this visit.     Objective:   Physical Exam BP 142/89  Pulse 76  Temp(Src) 99.2 F (37.3 C) (Oral)  Ht 5\' 5"  (1.651 m)  Wt 171 lb 1.6 oz (77.61 kg)  BMI 28.47 kg/m2 Gen:  Alert, cooperative patient who appears stated  age in no acute distress.  Vital signs reviewed. HEENT: EOMI,  MMM Cardiac:  Regular rate and rhythm without murmur auscultated.  Good S1/S2. Pulm:  Clear to auscultation bilaterally with good air movement.  No wheezes or rales noted.   Abd:  Soft/nondistended/nontender.  Good bowel sounds throughout all four quadrants.  No masses noted.  Exts: Non edematous BL  LE, warm and well perfused.   Results for orders placed in visit on 05/17/13 (from the past 72 hour(s))  POCT URINALYSIS DIPSTICK     Status: Abnormal   Collection Time    05/17/13 11:25 AM      Result Value Range   Color, UA YELLOW     Clarity, UA CLEAR     Glucose, UA NEGATIVE     Bilirubin, UA NEGATIVE     Ketones, UA NEGATIVE     Spec Grav, UA 1.020     Blood, UA MODERATE     pH, UA 5.5     Protein, UA 100     Urobilinogen, UA 0.2     Nitrite, UA NEGATIVE     Leukocytes, UA Negative

## 2013-05-18 DIAGNOSIS — R0600 Dyspnea, unspecified: Secondary | ICD-10-CM | POA: Insufficient documentation

## 2013-05-18 NOTE — Assessment & Plan Note (Signed)
On mod-min exertion. This plus palpitations plus chest pain means cardiology referral for cardiac sources of dyspnea.   Likely needs echo along with history of intermittent LE edema and kidney disease.  Referral placed today.

## 2013-05-18 NOTE — Assessment & Plan Note (Signed)
Encouraged to call urology and schedule appt. Has private insurance and seen by them previously (2013). Can provide referral but don't think she'll need it.

## 2013-05-18 NOTE — Assessment & Plan Note (Signed)
EKG normal here in clinic. Associated at times with dyspnea but inconsistently. Referring to cards for further workup.

## 2013-05-18 NOTE — Assessment & Plan Note (Signed)
Encouraged to FU with urology [facility].  patient expressed understanding and said she would.

## 2013-05-18 NOTE — Assessment & Plan Note (Signed)
Likely cause of chest pain -- but still needs cards wu.

## 2013-05-20 ENCOUNTER — Telehealth: Payer: Self-pay | Admitting: Family Medicine

## 2013-05-20 MED ORDER — AMLODIPINE BESYLATE 5 MG PO TABS: 5.0000 mg | ORAL_TABLET | Freq: Every day | ORAL | Status: DC

## 2013-05-20 NOTE — Telephone Encounter (Addendum)
Discussed lab results, elevated creatinine, blood in urine with patient.  She is to start Norvasc and FU with [facility] as we discussed at visit.  Expressed understanding.

## 2013-05-20 NOTE — Telephone Encounter (Signed)
Pt will like to know labs results.  [place]

## 2013-05-26 ENCOUNTER — Telehealth: Payer: Self-pay | Admitting: Family Medicine

## 2013-05-26 MED ORDER — AMLODIPINE BESYLATE 5 MG PO TABS: 5.0000 mg | ORAL_TABLET | Freq: Every day | ORAL | Status: DC

## 2013-05-26 NOTE — Telephone Encounter (Signed)
Pt called and the pharmacy and they said that they never received a prescription from [place]. Can we send one in for her BP medication. jw

## 2013-05-26 NOTE — Telephone Encounter (Signed)
Spoke to patient and sent in rx.

## 2013-05-31 ENCOUNTER — Telehealth: Payer: Self-pay | Admitting: *Deleted

## 2013-05-31 NOTE — Telephone Encounter (Signed)
[facility] called our office back.  No record of patient having coverage.  Requests that we call number on back of patient's insurance card.  Spoke with pharmacy---med has already been processed with patient's "savings card."  Patient has picked up med and nothing further needs to be done.  Nolene Ebbs, RN

## 2013-05-31 NOTE — Telephone Encounter (Signed)
Received fax from [facility] requesting prior authorization of amlodipine 5 mg #30.  Contacted [facility]/[phone] (ID # [id number] with Carmell Austria & they are reviewing info to see why PA was requested.  No record that med was processed by pharmacy.  Will receive call back from [facility] within 72 hours with status of PA request.  Burna Forts, BSN, RN-BC

## 2013-06-07 ENCOUNTER — Ambulatory Visit: Payer: BC Managed Care – PPO | Admitting: Cardiology

## 2013-06-24 ENCOUNTER — Encounter: Payer: Self-pay | Admitting: Cardiology

## 2013-06-24 ENCOUNTER — Ambulatory Visit (INDEPENDENT_AMBULATORY_CARE_PROVIDER_SITE_OTHER): Payer: BC Managed Care – PPO | Admitting: Cardiology

## 2013-06-24 VITALS — BP 130/82 | HR 76 | Ht 65.0 in | Wt 172.0 lb

## 2013-06-24 DIAGNOSIS — E785 Hyperlipidemia, unspecified: Secondary | ICD-10-CM

## 2013-06-24 DIAGNOSIS — R0600 Dyspnea, unspecified: Secondary | ICD-10-CM

## 2013-06-24 DIAGNOSIS — R0989 Other specified symptoms and signs involving the circulatory and respiratory systems: Secondary | ICD-10-CM

## 2013-06-24 DIAGNOSIS — R0609 Other forms of dyspnea: Secondary | ICD-10-CM | POA: Insufficient documentation

## 2013-06-24 DIAGNOSIS — R42 Dizziness and giddiness: Secondary | ICD-10-CM

## 2013-06-24 NOTE — Patient Instructions (Signed)
Your physician recommends that you continue on your current medications as directed. Please refer to the Current Medication list given to you today.  Lab Today: NMR lipoprofile, lipoprotein A  Your physician has requested that you have an exercise tolerance test. For further information please visit [email or web address]. Please also follow instruction sheet, as given.   Your physician has recommended that you wear a holter monitor. Holter monitors are medical devices that record the heart's electrical activity. Doctors most often use these monitors to diagnose arrhythmias. Arrhythmias are problems with the speed or rhythm of the heartbeat. The monitor is a small, portable device. You can wear one while you do your normal daily activities. This is usually used to diagnose what is causing palpitations/syncope (passing out).

## 2013-06-24 NOTE — Progress Notes (Signed)
Patient ID: [name], female   DOB: 1955/05/14, 58 y.o.   MRN: [id number]     Patient Name: [name] Date of Encounter: 06/24/2013  Primary Care Provider:  Kennith Maes, DO Primary Cardiologist:  Dorothy Spark  Problem List   Past Medical History  Diagnosis Date  . RENAL INSUFFICIENCY, CHRONIC 06/18/2006    Qualifier: Diagnosis of  By: Darylene Price MD, Elta Guadeloupe    . OSTEOPENIA 06/18/2006    Qualifier: Diagnosis of  By: Darylene Price MD, Elta Guadeloupe    . MIGRAINE, UNSPEC., W/O INTRACTABLE MIGRAINE 06/18/2006    Qualifier: Diagnosis of  By: Darylene Price MD, Elta Guadeloupe     Past Surgical History  Procedure Laterality Date  . Appendectomy      Allergies  Allergies  Allergen Reactions  . Sulfamethoxazole     REACTION: unspecified    HPI A very pleasant 58 year old female with no significant prior medical history other than sciatica pain and gastric reflex disease. The patient is coming with concerns of exertional shortness of breath. She works as a Teacher, early years/pre and states that lately walking from parking lot to 2 buildings and would make her profoundly short of breath. She denies any chest pain. She was recently prescribed medication for her hypertension however she states that her blood pressure has been normal and she hasn't started to take it yet. The patient also complains of palpitations that only lasts few seconds but are all frequently associated with dizziness or shortness of breath. Does happens about twice a week and have been increasing in frequency lately. Patient has significant family history of coronary artery disease were multiple aunts, uncles, grandmother and nephew at age of 60 had a heart attacks. She states that her cholesterol has been borderline and she has never been treated for it.  Home Medications  Prior to Admission medications   Medication Sig Start Date End Date Taking? Authorizing Provider  amLODipine (NORVASC) 5 MG tablet Take 1 tablet (5 mg total) by mouth daily. 05/26/13    Nolon Rod, DO  cyclobenzaprine (FLEXERIL) 10 MG tablet  05/25/13   Historical Provider, MD  HYDROcodone-acetaminophen (NORCO/VICODIN) 5-325 MG per tablet Take 1 tablet by mouth every 6 (six) hours as needed for pain. 12/22/12   Amber Fidel Levy, MD    Family History  Family History  Problem Relation Age of Onset  . Kidney disease Mother   . Kidney disease Maternal Uncle   . Kidney disease Cousin     Social History  History   Social History  . Marital Status: Single    Spouse Name: N/A    Number of Children: N/A  . Years of Education: N/A   Occupational History  . school bus driver    Social History Main Topics  . Smoking status: Never Smoker   . Smokeless tobacco: Not on file  . Alcohol Use: No  . Drug Use: No  . Sexual Activity: Not on file   Other Topics Concern  . Not on file   Social History Narrative  . No narrative on file     Review of Systems, as per HPI, otherwise negative General:  No chills, fever, night sweats or weight changes.  Cardiovascular:  No chest pain, dyspnea on exertion, edema, orthopnea, palpitations, paroxysmal nocturnal dyspnea. Dermatological: No rash, lesions/masses Respiratory: No cough, dyspnea Urologic: No hematuria, dysuria Abdominal:   No nausea, vomiting, diarrhea, bright red blood per rectum, melena, or hematemesis Neurologic:  No visual changes, wkns, changes in mental  status. All other systems reviewed and are otherwise negative except as noted above.  Physical Exam  Height 5\' 5"  (1.651 m), weight 172 lb (78.019 kg).  General: Pleasant, NAD Psych: Normal affect. Neuro: Alert and oriented X 3. Moves all extremities spontaneously. HEENT: Normal  Neck: Supple without bruits or JVD. Lungs:  Resp regular and unlabored, CTA. Heart: RRR no s3, s4, or murmurs. Abdomen: Soft, non-tender, non-distended, BS + x 4.  Extremities: No clubbing, cyanosis or edema. DP/PT/Radials 2+ and equal bilaterally.  Labs:  No results found  for this basename: CKTOTAL, CKMB, TROPONINI,  in the last 72 hours Lab Results  Component Value Date   WBC 6.8 05/17/2013   HGB 12.7 05/17/2013   HCT 38.0 05/17/2013   MCV 83.0 05/17/2013   PLT 382 05/17/2013   No results found for this basename: NA, K, CL, CO2, BUN, CREATININE, CALCIUM, LABALBU, PROT, BILITOT, ALKPHOS, ALT, AST, GLUCOSE,  in the last 168 hours Lab Results  Component Value Date   CHOL 227* 03/17/2012   HDL 74 03/17/2012   LDLCALC 129* 03/17/2012   TRIG 120 03/17/2012   No results found for this basename: DDIMER   No components found with this basename: POCBNP,   Accessory Clinical Findings  echocardiogram  ECG - SR, normal ECG   Assessment & Plan  Pleasant 58 year old female with   1. dyspnea on exertion - Exercise treadmill stress test  2. Hypertension - controlled no need to treat at this point, we'll evaluate blood pressure response to exercise during stress test  3. Hyperlipidemia - considering her significant family history we will evaluate NMR lipid analysis and a lipoprotein a.  4. Palpitations - 48 hour Holter monitor  Followup in 4 weeks  Dorothy Spark, MD, [facility] 06/24/2013, 10:11 AM

## 2013-06-25 LAB — LIPOPROTEIN A (LPA): Lipoprotein (a): 34 mg/dL — ABNORMAL HIGH (ref 0–30)

## 2013-06-27 LAB — NMR LIPOPROFILE WITH LIPIDS
Cholesterol, Total: 246 mg/dL — ABNORMAL HIGH (ref <200)
HDL Particle Number: 34.2 umol/L (ref >=30.5)
HDL Size: 8.9 nm — ABNORMAL LOW (ref >=9.2)
HDL-C: 62 mg/dL (ref >=40)
LDL (calc): 152 mg/dL — ABNORMAL HIGH (ref <100)
LDL Particle Number: 1919 nmol/L — ABNORMAL HIGH (ref <1000)
LDL Size: 20.6 nm (ref >20.5)
LP-IR Score: 45 (ref <=45)
Large HDL-P: 5.6 umol/L (ref >=4.8)
Large VLDL-P: 1.1 nmol/L (ref <=2.7)
Small LDL Particle Number: 760 nmol/L — ABNORMAL HIGH (ref <=527)
Triglycerides: 160 mg/dL — ABNORMAL HIGH (ref <150)
VLDL Size: 49 nm — ABNORMAL HIGH (ref <=46.6)

## 2013-07-07 ENCOUNTER — Encounter: Payer: Self-pay | Admitting: *Deleted

## 2013-07-07 ENCOUNTER — Encounter (INDEPENDENT_AMBULATORY_CARE_PROVIDER_SITE_OTHER): Payer: BC Managed Care – PPO

## 2013-07-07 DIAGNOSIS — R42 Dizziness and giddiness: Secondary | ICD-10-CM

## 2013-07-07 NOTE — Progress Notes (Signed)
Patient ID: [name], female   DOB: 10/29/1955, 58 y.o.   MRN: [id number] E-Cardio 48 hour holter monitor applied to patient.

## 2013-07-18 ENCOUNTER — Telehealth: Payer: Self-pay

## 2013-07-18 NOTE — Telephone Encounter (Signed)
**Note De-Identified Jonika Critz Obfuscation** The pt is advised, she verbalized understanding.

## 2013-07-18 NOTE — Telephone Encounter (Signed)
LMTCB.  Per Dr Meda Coffee the pts 48 hour holter monitor results are normal.

## 2013-08-04 ENCOUNTER — Encounter: Payer: Self-pay | Admitting: *Deleted

## 2013-08-04 ENCOUNTER — Ambulatory Visit (INDEPENDENT_AMBULATORY_CARE_PROVIDER_SITE_OTHER): Payer: BC Managed Care – PPO | Admitting: Physician Assistant

## 2013-08-04 ENCOUNTER — Telehealth: Payer: Self-pay | Admitting: *Deleted

## 2013-08-04 DIAGNOSIS — R0989 Other specified symptoms and signs involving the circulatory and respiratory systems: Secondary | ICD-10-CM

## 2013-08-04 DIAGNOSIS — R0609 Other forms of dyspnea: Secondary | ICD-10-CM

## 2013-08-04 NOTE — Progress Notes (Signed)
Normal stress test, would you let her know? Thank you, Houston Siren

## 2013-08-04 NOTE — Progress Notes (Signed)
Exercise Treadmill Test  Pre-Exercise Testing Evaluation Rhythm: normal sinus  Rate: 67 bpm     Test  Exercise Tolerance Test Ordering MD: Ena Dawley, MD  Interpreting MD: Richardson Dopp, PA-C  Unique Test No: 1  Treadmill:  1  Indication for ETT: exertional dyspnea  Contraindication to ETT: No   Stress Modality: exercise - treadmill  Cardiac Imaging Performed: non   Protocol: standard Bruce - maximal  Max BP:  190/94  Max MPHR (bpm):  163 85% MPR (bpm):  139  MPHR obtained (bpm):  160 % MPHR obtained:  98  Reached 85% MPHR (min:sec):  6:19 Total Exercise Time (min-sec):  9:00  Workload in METS:  10.1 Borg Scale: 17  Reason ETT Terminated:  patient's desire to stop    ST Segment Analysis At Rest: non-specific ST segment slurring With Exercise: non-specific ST changes  Other Information Arrhythmia:  No Angina during ETT:  absent (0) Quality of ETT:  diagnostic  ETT Interpretation:  normal - no evidence of ischemia by ST analysis  Comments: Good exercise capacity. No chest pain. Normal BP response to exercise. No significant ST changes to suggest ischemia.   Recommendations: F/u with Dr. Ena Dawley as planned. Signed,  Richardson Dopp, PA-C   08/04/2013 10:53 AM

## 2013-08-04 NOTE — Telephone Encounter (Signed)
LMTCB - about normal gxt results

## 2013-08-05 NOTE — Telephone Encounter (Signed)
**Note De-Identified [name] Obfuscation** Message copied by Dmiya Malphrus, Deliah Boston on Fri Aug 05, 2013  9:01 AM ------      Message from: Dorothy Spark      Created: Thu Aug 04, 2013  4:18 PM                   ----- Message -----         From: Liliane Shi, PA-C         Sent: 08/04/2013  10:53 AM           To: Dorothy Spark, MD             ------

## 2013-08-05 NOTE — Telephone Encounter (Signed)
**Note De-Identified Sundus Pete Obfuscation** The pt is advised, she verbalized understanding.

## 2014-03-02 ENCOUNTER — Ambulatory Visit (INDEPENDENT_AMBULATORY_CARE_PROVIDER_SITE_OTHER): Payer: BC Managed Care – PPO | Admitting: Podiatry

## 2014-03-02 ENCOUNTER — Encounter: Payer: Self-pay | Admitting: Podiatry

## 2014-03-02 ENCOUNTER — Ambulatory Visit (INDEPENDENT_AMBULATORY_CARE_PROVIDER_SITE_OTHER): Payer: BC Managed Care – PPO

## 2014-03-02 VITALS — BP 145/89 | HR 81 | Resp 16

## 2014-03-02 DIAGNOSIS — M779 Enthesopathy, unspecified: Secondary | ICD-10-CM

## 2014-03-02 MED ORDER — TRIAMCINOLONE ACETONIDE 10 MG/ML IJ SUSP
10.0000 mg | Freq: Once | INTRAMUSCULAR | Status: AC
  Administered 2014-03-02: 10 mg

## 2014-03-02 NOTE — Progress Notes (Signed)
Subjective:     Patient ID: [name], female   DOB: May 24, 1955, 58 y.o.   MRN: [id number]  HPIpatient presents stating she's having a lot of pain underneath her right foot that's worsened recently but is been present for 8-10 months and makes it hard to walk comfortably. Also has history of back problems that she thought was causing her problems but does not appear to be contributory   Review of Systems  All other systems reviewed and are negative.      Objective:   Physical Exam  Constitutional: She is oriented to person, place, and time.  Cardiovascular: Intact distal pulses.   Musculoskeletal: Normal range of motion.  Neurological: She is oriented to person, place, and time.  Skin: Skin is warm.  Nursing note and vitals reviewed.  neurovascular status found to be intact with muscle strength adequate and range of motion of the subtalar midtarsal joint within normal limits. No elevation of the digit is noted but there is inflammation with pain in the second metatarsophalangeal joint upon palpation. Patient is well perfused and is well oriented 3     Assessment:     Inflammatory capsulitis of the second MPJ which may be structural in nature but no indications of flexor plate problems    Plan:     H&P and x-rays reviewed. Today I did a proximal nerve block aspirated the second MPJ was able to get out a small amount of fluid and injected with half cc of dexamethasone Kenalog and applied thick plantar pad to reduce pressure against the joint surface. Patient will be checked back and we'll do very well and long-term orthotic therapy

## 2014-03-02 NOTE — Progress Notes (Signed)
   Subjective:    Patient ID: [name], female    DOB: May 17, 1955, 58 y.o.   MRN: [id number]  HPI Comments: "My foot been hurting"  Patient c/o aching plantar forefoot and 1st toe right for about 8-10 months. Thought it was a back issue. She feels a knot. Worse with walking. Her whole leg aches when she is laying down. She takes flexerill and vicodin for her back daily.     Review of Systems  Cardiovascular: Positive for chest pain, palpitations and leg swelling.  Musculoskeletal: Positive for myalgias, back pain, arthralgias and gait problem.  All other systems reviewed and are negative.      Objective:   Physical Exam        Assessment & Plan:

## 2014-03-09 ENCOUNTER — Ambulatory Visit (INDEPENDENT_AMBULATORY_CARE_PROVIDER_SITE_OTHER): Payer: BC Managed Care – PPO | Admitting: Podiatry

## 2014-03-09 ENCOUNTER — Encounter: Payer: Self-pay | Admitting: Podiatry

## 2014-03-09 VITALS — BP 128/77 | HR 85 | Resp 16

## 2014-03-09 DIAGNOSIS — M79673 Pain in unspecified foot: Secondary | ICD-10-CM

## 2014-03-09 DIAGNOSIS — M779 Enthesopathy, unspecified: Secondary | ICD-10-CM

## 2014-03-09 NOTE — Progress Notes (Signed)
Subjective:     Patient ID: [name], female   DOB: [birth year], 58 y.o.   MRN: [id number]  HPI patient states that it is still hurting but improved from previous visit   Review of Systems     Objective:   Physical Exam Neurovascular status intact with diminishment of discomfort in the right second metatarsophalangeal joint but pain still present when pressed into the joint surface    Assessment:     Capsulitis improved right but still present    Plan:     Advised on physical therapy anti-inflammatories and long-term orthotics and instructed on orthotic usage and scanned for custom orthotics at this time. Dispensed pads

## 2014-03-24 ENCOUNTER — Telehealth: Payer: Self-pay | Admitting: Family Medicine

## 2014-03-24 DIAGNOSIS — Z1211 Encounter for screening for malignant neoplasm of colon: Secondary | ICD-10-CM

## 2014-03-24 NOTE — Telephone Encounter (Signed)
Need referral for a screening colonoscopy per patient's medical insurance company.

## 2014-03-28 NOTE — Telephone Encounter (Signed)
Completed.  Thanks, Tamela Oddi. Awanda Mink, DO of [facility] Penning [facility] 03/28/2014, 10:25 AM

## 2014-03-29 ENCOUNTER — Encounter: Payer: Self-pay | Admitting: Internal Medicine

## 2014-03-30 ENCOUNTER — Encounter: Payer: Self-pay | Admitting: Podiatry

## 2014-03-30 ENCOUNTER — Ambulatory Visit (INDEPENDENT_AMBULATORY_CARE_PROVIDER_SITE_OTHER): Payer: BC Managed Care – PPO | Admitting: Podiatry

## 2014-03-30 DIAGNOSIS — M779 Enthesopathy, unspecified: Secondary | ICD-10-CM

## 2014-03-30 NOTE — Patient Instructions (Signed)

## 2014-04-01 NOTE — Progress Notes (Signed)
Subjective:     Patient ID: Lowella Curb, female   DOB: Sep 19, 1955, 58 y.o.   MRN: [id number]  HPI patient states that my toe still aches but it's improved over where it was   Review of Systems     Objective:   Physical Exam Neurovascular status found to be intact with muscle strength adequate and range of motion of the subtalar midtarsal joint within normal limits. Patient continues to have moderate discomfort in the second MPJ but it has improved from previous visit with pain upon ambulation    Assessment:     Continued inflammatory capsulitis that is improving but present    Plan:     H&P and condition discussed with patient. At this time I dispensed orthotics with instructions on usage and we discussed different types of shoes that we'll be appropriate and the fact that I want her in more rigid bottom shoes. Patient will be seen back for [place] to recheck again in the next 4 weeks

## 2014-04-28 ENCOUNTER — Other Ambulatory Visit: Payer: BC Managed Care – PPO

## 2014-05-26 ENCOUNTER — Ambulatory Visit (AMBULATORY_SURGERY_CENTER): Payer: Self-pay

## 2014-05-26 VITALS — Ht 65.0 in | Wt 160.0 lb

## 2014-05-26 DIAGNOSIS — Z1211 Encounter for screening for malignant neoplasm of colon: Secondary | ICD-10-CM

## 2014-05-26 MED ORDER — MOVIPREP 100 G PO SOLR: 1.0000 | Freq: Once | ORAL | Status: DC

## 2014-05-26 NOTE — Progress Notes (Signed)
No allergies to eggs or soy No past problems with anesthesia No home oxygen No diet/weight loss meds  Has email  Emmi instructions given for colonoscopy 

## 2014-06-09 ENCOUNTER — Encounter: Payer: BC Managed Care – PPO | Admitting: Internal Medicine

## 2014-07-28 ENCOUNTER — Telehealth: Payer: Self-pay | Admitting: Podiatry

## 2014-07-28 NOTE — Telephone Encounter (Signed)
Pt called and was asking about her orthotics.She received one foot and is waiting on the other foot. Please call her back.

## 2014-07-31 ENCOUNTER — Ambulatory Visit (INDEPENDENT_AMBULATORY_CARE_PROVIDER_SITE_OTHER): Payer: BLUE CROSS/BLUE SHIELD | Admitting: Family Medicine

## 2014-07-31 ENCOUNTER — Encounter: Payer: Self-pay | Admitting: Family Medicine

## 2014-07-31 VITALS — BP 140/82 | HR 81 | Temp 99.0°F | Ht 65.0 in | Wt 159.0 lb

## 2014-07-31 DIAGNOSIS — S76911A Strain of unspecified muscles, fascia and tendons at thigh level, right thigh, initial encounter: Secondary | ICD-10-CM

## 2014-07-31 DIAGNOSIS — S76919A Strain of unspecified muscles, fascia and tendons at thigh level, unspecified thigh, initial encounter: Secondary | ICD-10-CM | POA: Insufficient documentation

## 2014-07-31 DIAGNOSIS — L738 Other specified follicular disorders: Secondary | ICD-10-CM | POA: Diagnosis not present

## 2014-07-31 NOTE — Progress Notes (Signed)
Patient ID: [name], female   DOB: 17-Mar-1956, 59 y.o.   MRN: [id number]   [facility] Aquilla Hacker, MD Phone: [phone]  Subjective:   # Right Leg Folliculitis - Pt. Here with pustules that developed on her posterior right upper thigh as well as 2-3 larger pustules on her inner right thigh.  - These occurred 5 days ago after going to the beach and getting in a hot tub 7 days ago.  - She says that she has not had fever, chills, drainage, or constant pain.  - there is some discomfort with sitting and applying pressure to the affected areas.  - She has not tried any medications for them.  - She has never had these before.  - She was at the beach alone, and does not know if anyone else who has used the tub had these symptoms.  - She did not have any skin breaks in this area prior to swimming in the ocean or getting in the tub.  - She says that they have not bothered her much.   # Right Inguinal Pain - Pt. Also is complaining of pain radiating from her right inguinal crease into her abodmen.  - She has had her appendix removed.  - She has never had this before either.  - she says that she has had to walk and sit differently due to the discomfort from the lesions on her leg.  - She says that the pain is worse when she flexes her right leg at the hip.  - She has no difficulty ambulating, but does describe some discomfort.  - She has full range of motion of her hip.  - She has not had any changes in urination, fevers, or chills as above.  - She has not had any change in bowel habits.   All relevant systems were reviewed and were negative unless otherwise noted in the HPI  Past Medical History Reviewed problem list.  Medications- reviewed and updated Current Outpatient Prescriptions  Medication Sig Dispense Refill  . cyclobenzaprine (FLEXERIL) 10 MG tablet     . HYDROcodone-acetaminophen (NORCO/VICODIN) 5-325 MG per tablet Take 1 tablet by mouth every 6  (six) hours as needed for pain. 20 tablet 0  . MOVIPREP 100 G SOLR Take 1 kit (200 g total) by mouth once. 1 kit 0   No current facility-administered medications for this visit.   Chief complaint-noted No additions to family history Social history- patient is a Non smoker  Objective: BP 140/82 mmHg  Pulse 81  Temp(Src) 99 F (37.2 C) (Oral)  Ht 5' 5" (1.651 m)  Wt 159 lb (72.122 kg)  BMI 26.46 kg/m2 Gen: NAD, alert, cooperative with exam HEENT: NCAT, EOMI, PERRL, MMM Neck: FROM, supple CV: RRR, good S1/S2, no murmur Resp: CTABL, no wheezes, non-labored Abd: SNTND, BS present, no guarding or organomegaly Ext: No edema, warm, normal tone, moves UE/LE spontaneously RLE: With palpation of right inguinal area, and flexion of the right thigh at the hip, patient experiences some tenderness. This is relieved with extension of the hip. Pain is worse with flexion of the hip against resistance.  Neuro: Alert and oriented, No gross deficits  Skin: Small group of erythematous pustules over posterior thigh, and 2-3 small pustules over medial thigh area. Mildly tender to palpation. No deep fluid collections palpated, no fluctuance.   Assessment/Plan: See problem based a/p.

## 2014-07-31 NOTE — Patient Instructions (Signed)
Thanks for coming in today!   You have Folliculitis likely due to the hot tub at the beach. You should use warm compresses to help keep the infection from worsening, but your body should be ablet o fight it fof on its own.   You do not need an antibiotic at this time, but if if worsens, does not improve, or if you develop fevers, chills, nausea, vomiting, or any other concerning signs / symptoms, then please return for evaluation.   Your pain is most likely due to a strain of your psoas muscle based on my examination. You should rest your right leg as much as possible, and I expect itr to resolve soon. You may use NSAID's for pain control if you need to.    Thanks for letting us take care of you.   Sincerely,  Paula Compton, MD Family Medicine - PGY1

## 2014-08-11 NOTE — Telephone Encounter (Signed)
After sending multiple emails to manufacturer finally spoke with someone who stated that they neglected to follow through with order for remake they will rush the remake out to [place] today. Left another message for patient at her home number with update.

## 2014-08-30 ENCOUNTER — Encounter: Payer: Self-pay | Admitting: Family Medicine

## 2014-08-30 ENCOUNTER — Ambulatory Visit (INDEPENDENT_AMBULATORY_CARE_PROVIDER_SITE_OTHER): Payer: BLUE CROSS/BLUE SHIELD | Admitting: Family Medicine

## 2014-08-30 VITALS — BP 120/86 | HR 88 | Temp 98.7°F | Ht 65.0 in | Wt 163.8 lb

## 2014-08-30 DIAGNOSIS — M542 Cervicalgia: Secondary | ICD-10-CM | POA: Diagnosis not present

## 2014-08-30 DIAGNOSIS — M75101 Unspecified rotator cuff tear or rupture of right shoulder, not specified as traumatic: Secondary | ICD-10-CM | POA: Diagnosis not present

## 2014-08-30 DIAGNOSIS — M75100 Unspecified rotator cuff tear or rupture of unspecified shoulder, not specified as traumatic: Secondary | ICD-10-CM | POA: Insufficient documentation

## 2014-08-30 NOTE — Patient Instructions (Signed)
Great to meet you!  Please see the uptodate article on parotitis I gave you for more info

## 2014-08-30 NOTE — Assessment & Plan Note (Signed)
Rotator cuff tear diagnosed by orthopedic surgeon 10+ years ago Patient wanting referral to discuss surgery- explained that she is likely outside the window for surgery Refer to ortho to see her original orthopedist

## 2014-08-30 NOTE — Assessment & Plan Note (Signed)
Right-sided neck pain with swelling that is now resolving. Considering patient's story and recurrent nature of this pain think it's at least reasonable to think that this may be sialoadenitis and that she is clear salivary stone. She has some tenderness still of the right parotid gland. No stone visible on the mouth floor Jawline is palpable with only mild tenderness Also tenderness of the TMJ as well as across the SCM on the right side Reassurance provided, red flags reviewed in detail

## 2014-08-30 NOTE — Progress Notes (Signed)
Patient ID: [name], female   DOB: 09/02/1955, 59 y.o.   MRN: [id number]   HPI  Patient presents today for neck/facial pain and swelling  Patient explains this started 2-3 days ago and has improved overnight. She describes right-sided parotid swelling with tenderness to palpation and also tenderness with turning her head to the right or flexing her head to the right. She denies fever, chills, loss of appetite, or dyspnea.  She does have thyroid nodules that are known and have been recently biopsied by ENT and shown to to be benign, however she's worried about thyroid cancer as her uncle recently died from this.  She also notes dry mouth and dry eyes morning. Did not persist throughout the day. She also describes right-sided rotator cuff tear about 10+ years ago diagnosed by Dr. Ninfa Linden an orthopedic surgeon at [place]. 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  He denies headache but states that she does have some sharp shooting pain from her parotid area up to her head throughout this time  Pmh: Smoking status noted - never smoker ROS: Per HPI  Objective: BP 120/86 mmHg  Pulse 88  Temp(Src) 98.7 F (37.1 C) (Oral)  Ht 5\' 5"  (1.651 m)  Wt 163 lb 12.8 oz (74.299 kg)  BMI 27.26 kg/m2 Gen: NAD, alert, cooperative with exam HEENT: NCAT, tenderness to palpation of the parotid, no stone palpable, no stone visible salivary ducts, slight tenderness TMJ, also slight tenderness across the SCM on the right side but not consistent tenderness with flexing of the R SCM by turning head to the left, also slight tenderness with flexing the neck CV: RRR, good S1/S2, no murmur Resp: CTABL, no wheezes, non-labored Ext: No edema, warm Neuro: Alert and oriented, No gross deficits  Assessment and plan:  NECK PAIN, RIGHT Right-sided neck pain with swelling that is now resolving. Considering patient's story and recurrent  nature of this pain think it's at least reasonable to think that this may be sialoadenitis and that she is clear salivary stone. She has some tenderness still of the right parotid gland. No stone visible on the mouth floor Jawline is palpable with only mild tenderness Also tenderness of the TMJ as well as across the SCM on the right side Reassurance provided, red flags reviewed in detail   Rotator cuff syndrome Rotator cuff tear diagnosed by orthopedic surgeon 10+ years ago Patient wanting referral to discuss surgery- explained that she is likely outside the window for surgery Refer to ortho to see her original orthopedist     Orders Placed This Encounter  Procedures  . Ambulatory referral to Orthopedic Surgery    Referral Priority:  Routine    Referral Type:  Surgical    Referral Reason:  Specialty Services Required    Requested Specialty:  Orthopedic Surgery    Number of Visits Requested:  1

## 2014-09-12 ENCOUNTER — Other Ambulatory Visit: Payer: Self-pay | Admitting: Family Medicine

## 2014-09-12 DIAGNOSIS — Z1231 Encounter for screening mammogram for malignant neoplasm of breast: Secondary | ICD-10-CM

## 2014-09-25 ENCOUNTER — Ambulatory Visit (HOSPITAL_COMMUNITY)
Admission: RE | Admit: 2014-09-25 | Discharge: 2014-09-25 | Disposition: A | Discharge status: Home / Self Care | Payer: BLUE CROSS/BLUE SHIELD | Source: Ambulatory Visit | Attending: Family Medicine | Admitting: Family Medicine

## 2014-09-25 DIAGNOSIS — Z1231 Encounter for screening mammogram for malignant neoplasm of breast: Secondary | ICD-10-CM | POA: Diagnosis not present

## 2014-10-04 ENCOUNTER — Other Ambulatory Visit: Payer: Self-pay | Admitting: Obstetrics & Gynecology

## 2014-10-05 LAB — CYTOLOGY - PAP

## 2014-10-11 ENCOUNTER — Encounter: Payer: BLUE CROSS/BLUE SHIELD | Admitting: Obstetrics and Gynecology

## 2014-10-30 ENCOUNTER — Encounter (HOSPITAL_COMMUNITY)
Admission: RE | Admit: 2014-10-30 | Discharge: 2014-10-30 | Disposition: A | Discharge status: Home / Self Care | Payer: BLUE CROSS/BLUE SHIELD | Source: Ambulatory Visit | Attending: Orthopedic Surgery | Admitting: Orthopedic Surgery

## 2014-10-30 ENCOUNTER — Encounter (HOSPITAL_COMMUNITY): Payer: Self-pay

## 2014-10-30 DIAGNOSIS — M7541 Impingement syndrome of right shoulder: Secondary | ICD-10-CM | POA: Diagnosis not present

## 2014-10-30 DIAGNOSIS — M75101 Unspecified rotator cuff tear or rupture of right shoulder, not specified as traumatic: Secondary | ICD-10-CM | POA: Diagnosis not present

## 2014-10-30 DIAGNOSIS — M12811 Other specific arthropathies, not elsewhere classified, right shoulder: Secondary | ICD-10-CM | POA: Diagnosis not present

## 2014-10-30 DIAGNOSIS — G8929 Other chronic pain: Secondary | ICD-10-CM | POA: Diagnosis not present

## 2014-10-30 HISTORY — DX: Other complications of anesthesia, initial encounter: T88.59XA

## 2014-10-30 HISTORY — DX: Nontoxic single thyroid nodule: E04.1

## 2014-10-30 HISTORY — DX: Personal history of other diseases of the digestive system: Z87.19

## 2014-10-30 HISTORY — DX: Cardiac arrhythmia, unspecified: I49.9

## 2014-10-30 HISTORY — DX: Gastro-esophageal reflux disease without esophagitis: K21.9

## 2014-10-30 HISTORY — DX: Adverse effect of unspecified anesthetic, initial encounter: T41.45XA

## 2014-10-30 HISTORY — DX: Other specified postprocedural states: R11.2

## 2014-10-30 HISTORY — DX: Other specified postprocedural states: Z98.890

## 2014-10-30 LAB — CBC WITH DIFFERENTIAL/PLATELET
Basophils Absolute: 0.1 10*3/uL (ref 0.0–0.1)
Basophils Relative: 1 % (ref 0–1)
EOS ABS: 0.1 10*3/uL (ref 0.0–0.7)
Eosinophils Relative: 2 % (ref 0–5)
HEMATOCRIT: 39 % (ref 36.0–46.0)
HEMOGLOBIN: 13.3 g/dL (ref 12.0–15.0)
Lymphocytes Relative: 41 % (ref 12–46)
Lymphs Abs: 2.6 10*3/uL (ref 0.7–4.0)
MCH: 28.1 pg (ref 26.0–34.0)
MCHC: 34.1 g/dL (ref 30.0–36.0)
MCV: 82.5 fL (ref 78.0–100.0)
Monocytes Absolute: 0.5 10*3/uL (ref 0.1–1.0)
Monocytes Relative: 7 % (ref 3–12)
Neutro Abs: 3.2 10*3/uL (ref 1.7–7.7)
Neutrophils Relative %: 49 % (ref 43–77)
Platelets: 352 10*3/uL (ref 150–400)
RBC: 4.73 MIL/uL (ref 3.87–5.11)
RDW: 12.4 % (ref 11.5–15.5)
WBC: 6.5 10*3/uL (ref 4.0–10.5)

## 2014-10-30 LAB — COMPREHENSIVE METABOLIC PANEL
ALT: 15 U/L (ref 14–54)
ANION GAP: 8 (ref 5–15)
AST: 18 U/L (ref 15–41)
Albumin: 3.7 g/dL (ref 3.5–5.0)
Alkaline Phosphatase: 80 U/L (ref 38–126)
BILIRUBIN TOTAL: 0.4 mg/dL (ref 0.3–1.2)
BUN: 20 mg/dL (ref 6–20)
CO2: 25 mmol/L (ref 22–32)
Calcium: 9.9 mg/dL (ref 8.9–10.3)
Chloride: 108 mmol/L (ref 101–111)
Creatinine, Ser: 1.42 mg/dL — ABNORMAL HIGH (ref 0.44–1.00)
GFR calc non Af Amer: 40 mL/min — ABNORMAL LOW (ref >60)
GFR, EST AFRICAN AMERICAN: 46 mL/min — AB (ref >60)
GLUCOSE: 99 mg/dL (ref 65–99)
POTASSIUM: 4.2 mmol/L (ref 3.5–5.1)
SODIUM: 141 mmol/L (ref 135–145)
Total Protein: 7.5 g/dL (ref 6.5–8.1)

## 2014-10-30 LAB — APTT: aPTT: 29 seconds (ref 24–37)

## 2014-10-30 LAB — PROTIME-INR
INR: 1.05 (ref 0.00–1.49)
Prothrombin Time: 13.9 seconds (ref 11.6–15.2)

## 2014-10-30 NOTE — Pre-Procedure Instructions (Signed)
[name]  10/30/2014      [place] [address] Phone: [phone] Fax: [phone]  [id number], [place] [address] Phone: [phone] Fax: [phone]    Your procedure is scheduled on   Thursday 11/02/14  Report to [facility] Admitting at   820  A.M.  Call this number if you have problems the morning of surgery:  [phone]   Remember:  Do not eat food or drink liquids after midnight.  Take these medicines the morning of surgery with A SIP OF WATER  HYDROCODONE IF NEEDED   Do not wear jewelry, make-up or nail polish.  Do not wear lotions, powders, or perfumes.  You may wear deodorant.  Do not shave 48 hours prior to surgery.  Men may shave face and neck.  Do not bring valuables to the hospital.  [facility] is not responsible for any belongings or valuables.  Contacts, dentures or bridgework may not be worn into surgery.  Leave your suitcase in the car.  After surgery it may be brought to your room.  For patients admitted to the hospital, discharge time will be determined by your treatment team.  Patients discharged the day of surgery will not be allowed to drive home.   Name and phone number of your driver:    Special instructions:  [place] - Preparing for Surgery  Before surgery, you can play an important role.  Because skin is not sterile, your skin needs to be as free of germs as possible.  You can reduce the number of germs on you skin by washing with CHG (chlorahexidine gluconate) soap before surgery.  CHG is an antiseptic cleaner which kills germs and bonds with the skin to continue killing germs even after washing.  Please DO NOT use if you have an allergy to CHG or antibacterial soaps.  If your skin becomes reddened/irritated stop using the CHG and inform your nurse when you  arrive at Short Stay.  Do not shave (including legs and underarms) for at least 48 hours prior to the first CHG shower.  You may shave your face.  Please follow these instructions carefully:   1.  Shower with CHG Soap the night before surgery and the                                morning of Surgery.  2.  If you choose to wash your hair, wash your hair first as usual with your       normal shampoo.  3.  After you shampoo, rinse your hair and body thoroughly to remove the                      Shampoo.  4.  Use CHG as you would any other liquid soap.  You can apply chg directly       to the skin and wash gently with scrungie or a clean washcloth.  5.  Apply the CHG Soap to your body ONLY FROM THE NECK DOWN.        Do not use on open wounds or open sores.  Avoid contact with your eyes,       ears, mouth and genitals (private parts).  Wash genitals (private parts)  with your normal soap.  6.  Wash thoroughly, paying special attention to the area where your surgery        will be performed.  7.  Thoroughly rinse your body with warm water from the neck down.  8.  DO NOT shower/wash with your normal soap after using and rinsing off       the CHG Soap.  9.  Pat yourself dry with a clean towel.            10.  Wear clean pajamas.            11.  Place clean sheets on your bed the night of your first shower and do not        sleep with pets.  Day of Surgery  Do not apply any lotions/deoderants the morning of surgery.  Please wear clean clothes to the hospital/surgery center.    Please read over the following fact sheets that you were given. Pain Booklet, Coughing and Deep Breathing and Surgical Site Infection Prevention

## 2014-11-01 MED ORDER — LACTATED RINGERS IV SOLN
INTRAVENOUS | Status: DC
  Administered 2014-11-02: 09:00:00 via INTRAVENOUS

## 2014-11-01 MED ORDER — CEFAZOLIN SODIUM-DEXTROSE 2-3 GM-% IV SOLR
2.0000 g | INTRAVENOUS | Status: AC
  Administered 2014-11-02: 2 g via INTRAVENOUS
  Filled 2014-11-01: qty 50

## 2014-11-02 ENCOUNTER — Encounter (HOSPITAL_COMMUNITY)
Admission: RE | Disposition: A | Discharge status: Home / Self Care | Payer: Self-pay | Source: Ambulatory Visit | Attending: Orthopedic Surgery

## 2014-11-02 ENCOUNTER — Encounter (HOSPITAL_COMMUNITY): Payer: Self-pay | Admitting: Surgery

## 2014-11-02 ENCOUNTER — Ambulatory Visit (HOSPITAL_COMMUNITY): Payer: BLUE CROSS/BLUE SHIELD | Admitting: Anesthesiology

## 2014-11-02 ENCOUNTER — Ambulatory Visit (HOSPITAL_COMMUNITY)
Admission: RE | Admit: 2014-11-02 | Discharge: 2014-11-02 | Disposition: A | Discharge status: Home / Self Care | Payer: BLUE CROSS/BLUE SHIELD | Source: Ambulatory Visit | Attending: Orthopedic Surgery | Admitting: Orthopedic Surgery

## 2014-11-02 DIAGNOSIS — M7541 Impingement syndrome of right shoulder: Secondary | ICD-10-CM | POA: Diagnosis not present

## 2014-11-02 DIAGNOSIS — M12811 Other specific arthropathies, not elsewhere classified, right shoulder: Secondary | ICD-10-CM | POA: Insufficient documentation

## 2014-11-02 DIAGNOSIS — G8929 Other chronic pain: Secondary | ICD-10-CM | POA: Insufficient documentation

## 2014-11-02 DIAGNOSIS — M75101 Unspecified rotator cuff tear or rupture of right shoulder, not specified as traumatic: Secondary | ICD-10-CM | POA: Insufficient documentation

## 2014-11-02 HISTORY — PX: SHOULDER ARTHROSCOPY WITH SUBACROMIAL DECOMPRESSION, ROTATOR CUFF REPAIR AND BICEP TENDON REPAIR: SHX5687

## 2014-11-02 SURGERY — SHOULDER ARTHROSCOPY WITH SUBACROMIAL DECOMPRESSION, ROTATOR CUFF REPAIR AND BICEP TENDON REPAIR
Anesthesia: General | Site: Shoulder | Laterality: Right

## 2014-11-02 MED ORDER — FENTANYL CITRATE (PF) 100 MCG/2ML IJ SOLN
100.0000 ug | Freq: Once | INTRAMUSCULAR | Status: AC
  Administered 2014-11-02: 100 ug via INTRAVENOUS

## 2014-11-02 MED ORDER — OXYCODONE-ACETAMINOPHEN 5-325 MG PO TABS: 1.0000 | ORAL_TABLET | ORAL | Status: DC | PRN

## 2014-11-02 MED ORDER — ONDANSETRON HCL 4 MG/2ML IJ SOLN
INTRAMUSCULAR | Status: AC
  Filled 2014-11-02: qty 2

## 2014-11-02 MED ORDER — ROCURONIUM BROMIDE 100 MG/10ML IV SOLN
INTRAVENOUS | Status: DC | PRN
  Administered 2014-11-02: 50 mg via INTRAVENOUS

## 2014-11-02 MED ORDER — PHENYLEPHRINE 40 MCG/ML (10ML) SYRINGE FOR IV PUSH (FOR BLOOD PRESSURE SUPPORT)
PREFILLED_SYRINGE | INTRAVENOUS | Status: AC
  Filled 2014-11-02: qty 10

## 2014-11-02 MED ORDER — ONDANSETRON HCL 4 MG/2ML IJ SOLN
INTRAMUSCULAR | Status: DC | PRN
  Administered 2014-11-02: 4 mg via INTRAVENOUS

## 2014-11-02 MED ORDER — FENTANYL CITRATE (PF) 250 MCG/5ML IJ SOLN
INTRAMUSCULAR | Status: AC
  Filled 2014-11-02: qty 5

## 2014-11-02 MED ORDER — NEOSTIGMINE METHYLSULFATE 10 MG/10ML IV SOLN
INTRAVENOUS | Status: AC
  Filled 2014-11-02: qty 1

## 2014-11-02 MED ORDER — DIAZEPAM 5 MG PO TABS
2.5000 mg | ORAL_TABLET | Freq: Four times a day (QID) | ORAL | Status: DC | PRN
Start: 2014-11-02 — End: 2016-01-17

## 2014-11-02 MED ORDER — MIDAZOLAM HCL 2 MG/2ML IJ SOLN
INTRAMUSCULAR | Status: AC
  Administered 2014-11-02: 2 mg
  Filled 2014-11-02: qty 2

## 2014-11-02 MED ORDER — PROMETHAZINE HCL 25 MG/ML IJ SOLN
6.2500 mg | INTRAMUSCULAR | Status: DC | PRN
  Administered 2014-11-02: 6.25 mg via INTRAVENOUS

## 2014-11-02 MED ORDER — PROMETHAZINE HCL 25 MG/ML IJ SOLN
INTRAMUSCULAR | Status: AC
  Administered 2014-11-02: 6.25 mg via INTRAVENOUS
  Filled 2014-11-02: qty 1

## 2014-11-02 MED ORDER — HYDROMORPHONE HCL 1 MG/ML IJ SOLN: 0.2500 mg | INTRAMUSCULAR | Status: DC | PRN

## 2014-11-02 MED ORDER — ARTIFICIAL TEARS OP OINT
TOPICAL_OINTMENT | OPHTHALMIC | Status: AC
  Filled 2014-11-02: qty 3.5

## 2014-11-02 MED ORDER — CHLORHEXIDINE GLUCONATE 4 % EX LIQD: 60.0000 mL | Freq: Once | CUTANEOUS | Status: DC

## 2014-11-02 MED ORDER — DEXAMETHASONE SODIUM PHOSPHATE 4 MG/ML IJ SOLN
INTRAMUSCULAR | Status: AC
  Filled 2014-11-02: qty 1

## 2014-11-02 MED ORDER — MIDAZOLAM HCL 2 MG/2ML IJ SOLN: 2.0000 mg | Freq: Once | INTRAMUSCULAR | Status: DC

## 2014-11-02 MED ORDER — SODIUM CHLORIDE 0.9 % IR SOLN
Status: DC | PRN
  Administered 2014-11-02: 6000 mL

## 2014-11-02 MED ORDER — DEXAMETHASONE SODIUM PHOSPHATE 4 MG/ML IJ SOLN
INTRAMUSCULAR | Status: DC | PRN
  Administered 2014-11-02: 4 mg via INTRAVENOUS

## 2014-11-02 MED ORDER — NEOSTIGMINE METHYLSULFATE 10 MG/10ML IV SOLN
INTRAVENOUS | Status: DC | PRN
  Administered 2014-11-02: 5 mg via INTRAVENOUS

## 2014-11-02 MED ORDER — NAPROXEN 500 MG PO TABS: 500.0000 mg | ORAL_TABLET | Freq: Two times a day (BID) | ORAL | Status: DC

## 2014-11-02 MED ORDER — SCOPOLAMINE 1 MG/3DAYS TD PT72
1.0000 | MEDICATED_PATCH | TRANSDERMAL | Status: DC
  Administered 2014-11-02: 1.5 mg via TRANSDERMAL

## 2014-11-02 MED ORDER — FENTANYL CITRATE (PF) 100 MCG/2ML IJ SOLN
INTRAMUSCULAR | Status: AC
  Filled 2014-11-02: qty 2

## 2014-11-02 MED ORDER — GLYCOPYRROLATE 0.2 MG/ML IJ SOLN
INTRAMUSCULAR | Status: AC
Start: 2014-11-02 — End: 2014-11-02
  Filled 2014-11-02: qty 1

## 2014-11-02 MED ORDER — FENTANYL CITRATE (PF) 100 MCG/2ML IJ SOLN
INTRAMUSCULAR | Status: DC | PRN
  Administered 2014-11-02: 100 ug via INTRAVENOUS

## 2014-11-02 MED ORDER — GLYCOPYRROLATE 0.2 MG/ML IJ SOLN
INTRAMUSCULAR | Status: AC
  Filled 2014-11-02: qty 3

## 2014-11-02 MED ORDER — PHENYLEPHRINE HCL 10 MG/ML IJ SOLN
INTRAMUSCULAR | Status: DC | PRN
  Administered 2014-11-02 (×2): 80 ug via INTRAVENOUS

## 2014-11-02 MED ORDER — ROPIVACAINE HCL 5 MG/ML IJ SOLN
INTRAMUSCULAR | Status: DC | PRN
  Administered 2014-11-02: 150 mg via PERINEURAL

## 2014-11-02 MED ORDER — GLYCOPYRROLATE 0.2 MG/ML IJ SOLN
INTRAMUSCULAR | Status: DC | PRN
  Administered 2014-11-02: .8 mg via INTRAVENOUS

## 2014-11-02 MED ORDER — SCOPOLAMINE 1 MG/3DAYS TD PT72
MEDICATED_PATCH | TRANSDERMAL | Status: AC
  Administered 2014-11-02: 1.5 mg via TRANSDERMAL
  Filled 2014-11-02: qty 1

## 2014-11-02 MED ORDER — PROPOFOL 10 MG/ML IV BOLUS
INTRAVENOUS | Status: DC | PRN
  Administered 2014-11-02: 140 mg via INTRAVENOUS

## 2014-11-02 MED ORDER — PROPOFOL 10 MG/ML IV BOLUS
INTRAVENOUS | Status: AC
  Filled 2014-11-02: qty 20

## 2014-11-02 MED ORDER — ROCURONIUM BROMIDE 50 MG/5ML IV SOLN
INTRAVENOUS | Status: AC
  Filled 2014-11-02: qty 1

## 2014-11-02 SURGICAL SUPPLY — 67 items
ANCH SUT SWLK 19.1X4.75 VT (Anchor) ×3 IMPLANT
ANCHOR PEEK 4.75X19.1 SWLK C (Anchor) ×6 IMPLANT
BLADE CUTTER GATOR 3.5 (BLADE) ×3 IMPLANT
BLADE GREAT WHITE 4.2 (BLADE) ×2 IMPLANT
BLADE GREAT WHITE 4.2MM (BLADE) ×1
BLADE SURG 11 STRL SS (BLADE) ×3 IMPLANT
BOOTCOVER CLEANROOM LRG (PROTECTIVE WEAR) ×6 IMPLANT
BUR OVAL 4.0 (BURR) ×3 IMPLANT
CANISTER SUCT LVC 12 LTR MEDI- (MISCELLANEOUS) ×3 IMPLANT
CANNULA ACUFLEX KIT 5X76 (CANNULA) ×3 IMPLANT
CANNULA DRILOCK 5.0MMX75MM (CANNULA) ×2
CANNULA DRILOCK 5.0X75 (CANNULA) ×3 IMPLANT
CANNULA TWIST IN 8.25X7CM (CANNULA) ×2 IMPLANT
CLOSURE STERI-STRIP 1/2X4 (GAUZE/BANDAGES/DRESSINGS) ×1
CLOSURE WOUND 1/2 X4 (GAUZE/BANDAGES/DRESSINGS) ×1
CLSR STERI-STRIP ANTIMIC 1/2X4 (GAUZE/BANDAGES/DRESSINGS) ×1 IMPLANT
CONNECTOR 5 IN 1 STRAIGHT STRL (MISCELLANEOUS) ×3 IMPLANT
DRAPE INCISE 23X17 IOBAN STRL (DRAPES)
DRAPE INCISE 23X17 STRL (DRAPES) IMPLANT
DRAPE INCISE IOBAN 23X17 STRL (DRAPES) IMPLANT
DRAPE STERI 35X30 U-POUCH (DRAPES) IMPLANT
DRAPE SURG 17X11 SM STRL (DRAPES) ×3 IMPLANT
DRAPE U-SHAPE 47X51 STRL (DRAPES) IMPLANT
DRSG PAD ABDOMINAL 8X10 ST (GAUZE/BANDAGES/DRESSINGS) ×4 IMPLANT
DURAPREP 26ML APPLICATOR (WOUND CARE) ×6 IMPLANT
ELECT REM PT RETURN 9FT ADLT (ELECTROSURGICAL) ×3
ELECTRODE REM PT RTRN 9FT ADLT (ELECTROSURGICAL) ×1 IMPLANT
GAUZE SPONGE 4X4 12PLY STRL (GAUZE/BANDAGES/DRESSINGS) ×3 IMPLANT
GLOVE BIO SURGEON STRL SZ7.5 (GLOVE) ×3 IMPLANT
GLOVE BIO SURGEON STRL SZ8 (GLOVE) ×3 IMPLANT
GLOVE EUDERMIC 7 POWDERFREE (GLOVE) ×3 IMPLANT
GLOVE SS BIOGEL STRL SZ 7.5 (GLOVE) ×1 IMPLANT
GLOVE SUPERSENSE BIOGEL SZ 7.5 (GLOVE) ×2
GOWN STRL REUS W/ TWL LRG LVL3 (GOWN DISPOSABLE) ×1 IMPLANT
GOWN STRL REUS W/ TWL XL LVL3 (GOWN DISPOSABLE) ×4 IMPLANT
GOWN STRL REUS W/TWL LRG LVL3 (GOWN DISPOSABLE) ×3
GOWN STRL REUS W/TWL XL LVL3 (GOWN DISPOSABLE) ×12
KIT BASIN OR (CUSTOM PROCEDURE TRAY) ×3 IMPLANT
KIT ROOM TURNOVER OR (KITS) ×3 IMPLANT
KIT SHOULDER TRACTION (DRAPES) ×3 IMPLANT
MANIFOLD NEPTUNE II (INSTRUMENTS) ×3 IMPLANT
NDL SCORPION MULTI FIRE (NEEDLE) IMPLANT
NDL SPNL 18GX3.5 QUINCKE PK (NEEDLE) ×1 IMPLANT
NDL SUT 6 .5 CRC .975X.05 MAYO (NEEDLE) IMPLANT
NEEDLE MAYO TAPER (NEEDLE)
NEEDLE SCORPION MULTI FIRE (NEEDLE) ×3 IMPLANT
NEEDLE SPNL 18GX3.5 QUINCKE PK (NEEDLE) ×3 IMPLANT
NS IRRIG 1000ML POUR BTL (IV SOLUTION) ×3 IMPLANT
PACK SHOULDER (CUSTOM PROCEDURE TRAY) ×3 IMPLANT
PAD ARMBOARD 7.5X6 YLW CONV (MISCELLANEOUS) ×6 IMPLANT
SET ARTHROSCOPY TUBING (MISCELLANEOUS) ×3
SET ARTHROSCOPY TUBING LN (MISCELLANEOUS) ×1 IMPLANT
SLING ARM LRG ADULT FOAM STRAP (SOFTGOODS) IMPLANT
SLING ARM MED ADULT FOAM STRAP (SOFTGOODS) ×3 IMPLANT
SPONGE GAUZE 4X4 12PLY STER LF (GAUZE/BANDAGES/DRESSINGS) ×2 IMPLANT
SPONGE LAP 4X18 X RAY DECT (DISPOSABLE) IMPLANT
STRIP CLOSURE SKIN 1/2X4 (GAUZE/BANDAGES/DRESSINGS) ×2 IMPLANT
SUT MNCRL AB 3-0 PS2 18 (SUTURE) ×3 IMPLANT
SUT MNCRL AB 4-0 PS2 18 (SUTURE) IMPLANT
SUT PDS AB 0 CT 36 (SUTURE) IMPLANT
SUT RETRIEVER GRASP 30 DEG (SUTURE) IMPLANT
SYR 20CC LL (SYRINGE) IMPLANT
TAPE PAPER 3X10 WHT MICROPORE (GAUZE/BANDAGES/DRESSINGS) ×3 IMPLANT
TOWEL OR 17X24 6PK STRL BLUE (TOWEL DISPOSABLE) ×3 IMPLANT
TOWEL OR 17X26 10 PK STRL BLUE (TOWEL DISPOSABLE) ×3 IMPLANT
WAND SUCTION MAX 4MM 90S (SURGICAL WAND) ×3 IMPLANT
WATER STERILE IRR 1000ML POUR (IV SOLUTION) ×3 IMPLANT

## 2014-11-02 NOTE — Transfer of Care (Signed)
Immediate Anesthesia Transfer of Care Note  Patient: [name]  Procedure(s) Performed: Procedure(s): RIGHT SHOULDER ARTHROSCOPY WITH SUBACROMIAL DECOMPRESSION, DISTAL CLAVICLE RESECTION, ROTATOR CUFF REPAIR  (Right)  Patient Location: PACU  Anesthesia Type:General  Level of Consciousness: sedated  Airway & Oxygen Therapy: Patient Spontanous Breathing and Patient connected to nasal cannula oxygen  Post-op Assessment: Report given to RN and Post -op Vital signs reviewed and stable  Post vital signs: Reviewed and stable  Last Vitals:  Filed Vitals:   11/02/14 0958  BP: 165/88  Pulse: 78  Temp:   Resp: 18    Complications: No apparent anesthesia complications

## 2014-11-02 NOTE — Anesthesia Postprocedure Evaluation (Signed)
Anesthesia Post Note  Patient: [name]  Procedure(s) Performed: Procedure(s) (LRB): RIGHT SHOULDER ARTHROSCOPY WITH SUBACROMIAL DECOMPRESSION, DISTAL CLAVICLE RESECTION, ROTATOR CUFF REPAIR  (Right)  Anesthesia type: general  Patient location: PACU  Post pain: Pain level controlled  Post assessment: Patient's Cardiovascular Status Stable  Last Vitals:  Filed Vitals:   11/02/14 1315  BP: 168/75  Pulse: 78  Temp:   Resp: 14    Post vital signs: Reviewed and stable  Level of consciousness: sedated  Complications: No apparent anesthesia complications

## 2014-11-02 NOTE — H&P (Signed)
Lowella Curb    Chief Complaint: RIGHT SHOULDER IMPINGEMENT  HPI: The patient is a 59 y.o. female with chronic right shoulder pain and impingement syndrome refractory to conservative Rx. Past Medical History  Diagnosis Date  . OSTEOPENIA 06/18/2006    Qualifier: Diagnosis of  By: Darylene Price MD, Elta Guadeloupe    . MIGRAINE, UNSPEC., W/O INTRACTABLE MIGRAINE 06/18/2006    Qualifier: Diagnosis of  By: Darylene Price MD, Elta Guadeloupe    . Complication of anesthesia   . PONV (postoperative nausea and vomiting)   . Thyroid nodule   . RENAL INSUFFICIENCY, CHRONIC 06/18/2006    Qualifier: Diagnosis of  By: Darylene Price MD, Elta Guadeloupe    . GERD (gastroesophageal reflux disease)   . History of hiatal hernia   . Dysrhythmia     PALPITATIONS WITH CAFFEINE/ [place]    Past Surgical History  Procedure Laterality Date  . Appendectomy    . Finger fracture surgery    . Nose surgery      BROKEN  . Laparoscopic endometriosis fulguration      LASER TO REMOVE BLOOD    Family History  Problem Relation Age of Onset  . Kidney disease Mother   . Kidney disease Maternal Uncle   . Kidney disease Cousin   . Colon cancer Neg Hx     Social History:  reports that she has never smoked. She does not have any smokeless tobacco history on file. She reports that she does not drink alcohol or use illicit drugs.  Allergies:  Allergies  Allergen Reactions  . Sulfamethoxazole Rash    REACTION: unspecified    Medications Prior to Admission  Medication Sig Dispense Refill  . HYDROcodone-acetaminophen (NORCO/VICODIN) 5-325 MG per tablet Take 1 tablet by mouth every 6 (six) hours as needed for pain. (Patient not taking: Reported on 10/27/2014) 20 tablet 0  . MOVIPREP 100 G SOLR Take 1 kit (200 g total) by mouth once. 1 kit 0     Physical Exam: right shoulder with painful and restricted motion as noted at recent office visits  Vitals  Temp:  [98.5 F (36.9 C)] 98.5 F (36.9 C) (07/14 0838) Pulse Rate:  [80-82] 80 (07/14 0930) Resp:  [19-20] 19  (07/14 0930) BP: (157)/(83) 157/83 mmHg (07/14 0838) SpO2:  [97 %-100 %] 100 % (07/14 0930) Weight:  [73.823 kg (162 lb 12 oz)] 73.823 kg (162 lb 12 oz) (07/14 0838)  Assessment/Plan  Impression: RIGHT SHOULDER IMPINGEMENT   Plan of Action: Procedure(s): RIGHT SHOULDER ARTHROSCOPY WITH SUBACROMIAL DECOMPRESSION, DISTAL CLAVICLE RESECTION, ROTATOR CUFF REPAIR   Vanity Larsson M 11/02/2014, 9:57 AM Contact # [phone]

## 2014-11-02 NOTE — Anesthesia Procedure Notes (Addendum)
Anesthesia Regional Block:  Interscalene brachial plexus block  Pre-Anesthetic Checklist: ,, timeout performed, Correct Patient, Correct Site, Correct Laterality, Correct Procedure, Correct Position, site marked, Risks and benefits discussed,  Surgical consent,  Pre-op evaluation,  At surgeon's request and post-op pain management  Laterality: Right  Prep: chloraprep       Needles:  Injection technique: Single-shot  Needle Type: Echogenic Stimulator Needle     Needle Length: 5cm 5 cm Needle Gauge: 22 and 22 G    Additional Needles:  Procedures: ultrasound guided (picture in chart) and nerve stimulator Interscalene brachial plexus block  Nerve Stimulator or Paresthesia:  Response: bicep contraction, 0.45 mA,   Additional Responses:   Narrative:  Start time: 11/02/2014 9:34 AM End time: 11/02/2014 9:44 AM Injection made incrementally with aspirations every 5 mL.  Performed by: Personally  Anesthesiologist: Duane Boston  Additional Notes: Functioning IV was confirmed and monitors applied.  A 79mm 22ga echogenic arrow stimulator was used. Sterile prep and drape,hand hygiene and sterile gloves were used.Ultrasound guidance: relevant anatomy identified, needle position confirmed, local anesthetic spread visualized around nerve(s)., vascular puncture avoided.  Image printed for medical record.  Negative aspiration and negative test dose prior to incremental administration of local anesthetic. The patient tolerated the procedure well.   Procedure Name: Intubation Date/Time: 11/02/2014 10:31 AM Performed by: Kyung Rudd Pre-anesthesia Checklist: Patient identified, Emergency Drugs available, Suction available, Patient being monitored and Timeout performed Patient Re-evaluated:Patient Re-evaluated prior to inductionOxygen Delivery Method: Circle system utilized Preoxygenation: Pre-oxygenation with 100% oxygen Intubation Type: IV induction Ventilation: Mask ventilation without  difficulty Laryngoscope Size: Mac and 4 Grade View: Grade III Tube type: Oral Tube size: 7.0 mm Number of attempts: 1 Airway Equipment and Method: Bougie stylet Placement Confirmation: positive ETCO2 and breath sounds checked- equal and bilateral Secured at: 20 cm Tube secured with: Tape Dental Injury: Injury to lip and Teeth and Oropharynx as per pre-operative assessment

## 2014-11-02 NOTE — Op Note (Signed)
11/02/2014  11:52 AM  PATIENT:   [name]  59 y.o. female  PRE-OPERATIVE DIAGNOSIS:  RIGHT SHOULDER IMPINGEMENT, AC joint OA, rotator cuff tear  POST-OPERATIVE DIAGNOSIS:  Same with labral tear  PROCEDURE:  RSA, labral debridement, SAD, DCR, RCR  SURGEON:  Amario Longmore, Metta Clines M.D.  ASSISTANTS: Shuford pac   ANESTHESIA:   GET + ISB  EBL: min  SPECIMEN:  none  Drains: none   PATIENT DISPOSITION:  PACU - hemodynamically stable.    PLAN OF CARE: Discharge to home after PACU  Dictation# [id number]   Contact # [phone]

## 2014-11-02 NOTE — Anesthesia Preprocedure Evaluation (Addendum)
Anesthesia Evaluation  Patient identified by MRN, date of birth, ID band Patient awake    Reviewed: Allergy & Precautions, NPO status , Patient's Chart, lab work & pertinent test results  History of Anesthesia Complications (+) PONV and history of anesthetic complications  Airway Mallampati: II  TM Distance: >3 FB Neck ROM: Full    Dental  (+) Teeth Intact, Dental Advisory Given   Pulmonary    Pulmonary exam normal       Cardiovascular + DOE Normal cardiovascular exam+ dysrhythmias     Neuro/Psych  Headaches,  Neuromuscular disease    GI/Hepatic Neg liver ROS, hiatal hernia, GERD-  ,  Endo/Other  negative endocrine ROS  Renal/GU Renal InsufficiencyRenal disease     Musculoskeletal   Abdominal   Peds  Hematology   Anesthesia Other Findings   Reproductive/Obstetrics                            Anesthesia Physical Anesthesia Plan  ASA: II  Anesthesia Plan: General   Post-op Pain Management:    Induction: Intravenous  Airway Management Planned: Oral ETT  Additional Equipment:   Intra-op Plan:   Post-operative Plan: Extubation in OR  Informed Consent: I have reviewed the patients History and Physical, chart, labs and discussed the procedure including the risks, benefits and alternatives for the proposed anesthesia with the patient or authorized representative who has indicated his/her understanding and acceptance.   Dental advisory given  Plan Discussed with: CRNA, Anesthesiologist and Surgeon  Anesthesia Plan Comments:        Anesthesia Quick Evaluation

## 2014-11-02 NOTE — Discharge Instructions (Signed)
° °  Metta Clines. Supple, M.D., F.A.A.O.S. Orthopaedic Surgery Specializing in Arthroscopic and Reconstructive Surgery of the Shoulder and Knee [phone] [address] [place], [place] [address] - Fax [phone]  POST-OP SHOULDER ARTHROSCOPIC ROTATOR CUFF  REPAIR INSTRUCTIONS  1. Call the office at [phone] to schedule your first post-op appointment 7-10 days from the date of your surgery.  2. Leave the steri-strips in place over your incisions when performing dressing changes and showering. You may remove your dressings and begin showering 72 hours from surgery. You can expect drainage that is clear to bloody in nature that occasionally will soak through your dressings. If this occurs go ahead and perform a dressing change. The drainage should lessen daily and when there is no drainage from your incisions feel free to go without a dressing.  3. Wear your sling/immobilizer at all times except to perform the exercises below or to occasionally let your arm dangle by your side to stretch your elbow. You also need to sleep in your sling immobilizer until instructed otherwise.  4. Range of motion to your elbow, wrist, and hand are encouraged 3-5 times daily. Exercise to your hand and fingers helps to reduce swelling you may experience.  5. Utilize ice to the shoulder 3-4 times minimum a day and additionally if you are experiencing pain.  6. You may one-armed drive when safely off of narcotics and muscle relaxants. You may use your hand that is in the sling to support the steering wheel only. However, should it be your right arm that is in the sling it is not to be used for gear shifting in a manual transmission.  7. If you had a block pre-operatively to provide post-op pain relief you may want to go ahead and begin utilizing your pain meds as your arm begins to wake up. Blocks can sometimes last up to 16-18 hours. If you are still pain-free prior to going to bed you may want to  strongly consider taking a pain medication to avoid being awakened in the night with the onset of pain. A muscle relaxant is also provided for you should you experience muscle spasms. It is recommended that if you are experiencing pain that your pain medication alone is not controlling, add the muscle relaxant along with the pain medication which can give additional pain relief. The first one to two days is generally the most severe of your pain and then should gradually decrease. As your pain lessens it is recommended that you decrease your use of the pain medications to an "as needed basis" only and to always comply with the recommended dosages of the pain medications.  8. Pain medications can produce constipation along with their use. If you experience this, the use of an over the counter stool softener or laxative daily is recommended.   9. For additional questions or concerns, please do not hesitate to call the office. If after hours there is an answering service to forward your concerns to the physician on call.  POST-OP EXERCISES  Pendulum Exercises  Perform pendulum exercises while standing and bending at the waist. Support your uninvolved arm on a table or chair and allow your operated arm to hang freely. Make sure to do these exercises passively - not using you shoulder muscle.  Repeat 20 times. Do 3 sessions per day.

## 2014-11-03 ENCOUNTER — Encounter (HOSPITAL_COMMUNITY): Payer: Self-pay | Admitting: Orthopedic Surgery

## 2014-11-03 NOTE — Op Note (Signed)
[name], [name]                ACCOUNT NO.:  [id number]  MEDICAL RECORD NO.:  [id number]  LOCATION:  [facility]                         FACILITY:  [facility]  PHYSICIAN:  Metta Clines. Daquana Paddock, M.D.  DATE OF BIRTH:  June 17, 1955  DATE OF PROCEDURE:  11/02/2014 DATE OF DISCHARGE:  11/02/2014                              OPERATIVE REPORT   PREOPERATIVE DIAGNOSES: 1. Chronic right shoulder pain with impingement syndrome. 2. Right shoulder rotator cuff tear. 3. Right shoulder symptomatic AC joint arthropathy.  POSTOPERATIVE DIAGNOSES: 1. Chronic right shoulder pain with impingement syndrome. 2. Right shoulder rotator cuff tear. 3. Right shoulder symptomatic AC joint arthropathy. 4. Degenerative labral tear.  PROCEDURE: 1. Right shoulder examination under anesthesia and right shoulder     acromioclavicular diagnostic arthroscopy. 2. Debridement of degenerative superior labral tear. 3. Arthroscopic subacromial decompression and bursectomy. 4. Arthroscopic distal clavicle resection. 5. Arthroscopic rotator cuff repair using a double-row suture bridge     repair construct.  SURGEON:  Metta Clines. Kalany Diekmann, M.D.  Terrence DupontOlivia Mackie A. Shuford, PA-C.  ANESTHESIA:  General endotracheal as well as an interscalene block.  ESTIMATED BLOOD LOSS:  Minimal.  DRAINS:  None.  HISTORY:  [name] is a 59 year old female who has chronic and progressive increasing right shoulder pain with impingement syndrome and symptoms that have been refractory to prolonged attempts at conservative management.  Preoperative MRI scan showed evidence for significant partial full-thickness rotator cuff tear.  Due to this ongoing pain and functional limitations, she was brought to the operating room at this time for planned right shoulder arthroscopy as described below.  Preoperatively, I counseled [name] regarding treatment options and potential risks versus benefits thereof.  Possible complications were reviewed  including bleeding, infection, neurovascular injury, persistent pain, loss of motion, anesthetic complication, recurrence of rotator cuff tear, and possible need for additional surgery.  She understands and accepts and agrees to the planned procedure.  PROCEDURE IN DETAIL:  After undergoing routine preop evaluation, the patient received prophylactic antibiotics and an interscalene block was established in the holding area by the Anesthesia Department.  Placed supine on the operating table, underwent smooth induction of general endotracheal anesthesia.  Turned to left lateral decubitus position on a beanbag and appropriately padded and protected.  Right shoulder examination under anesthesia revealed full motion.  No instability patterns noted.  Right arm suspended at 70 degrees of abduction with 10 pounds of traction.  The right shoulder girdle region was sterilely prepped and draped in standard fashion.  Time-out was called.  The posterior portal established at the glenohumeral joint.  The anterior portal was established under direct visualization.  Articular surfaces were all found to be in excellent condition.  No instability patterns noted.  There was degenerative tearing of the superior labrum consistent with type 1 SLAP lesion.  These areas were debrided with shaver back to stable margin.  Biceps anchor was stable.  Biceps tendon showed normal caliber.  No obvious proximal or distal instability.  The rotator cuff did not show any obvious defect when viewed from the articular side.  At this point, fluid and instruments were then removed from the glenohumeral joint.  The arm was dropped  down to 30 degrees of abduction.  Arthroscope introduced in the subacromial space through the posterior portal and direct lateral portal in the subacromial space. Abundant dense bursal tissue and multiple adhesions were encountered. These were all divided and excised with a combination of shaver  and Stryker wand.  The wand was then used to remove the periosteum from the undersurface of the anterior half of the acromion and then a subacromial depression was performed with a bur creating a type 1 morphology. Portals were then established directly into the distal clavicle and the distal clavicle resection was performed with a bur.  Care was taken to confirm visualization of entire circumference of the distal clavicle to ensure adequate removal of bone.  We then completed the subacromial/subdeltoid bursectomy.  During the rotator cuff on the bursal side, there was an obvious defect involving the distal supraspinatus and we probed this area, and this was essentially full- thickness defect with just a very thin layer of articular sided fibers intact.  We went ahead and completed the defect and debrided the rotator cuff back to healthy tissue and ultimately had a defect of approximately a cm and half in width.  We prepared the tuberosity removing soft tissue, abrading the bone to bleeding bed.  Through the stab wound off the lateral margin of acromion, we placed An Arthrex PEEK SwiveLock suture anchor loaded with 2 fiber tapes.  The fiber tapes were then shuttled equidistant across the width of the rotator cuff tear using the scorpion suture passer and then we placed 2 lateral row anchors.  Each carrying suture limbs of the Fiberwires allowing [place] to create excellent compression of the rotator cuff margin against bony bed of tuberosity. Overall construct was much to our satisfaction.  The fiber tapes were all then flipped appropriately.  At this point, final hemostasis was obtained.  Final debridement was completed.  Fluid and instruments were removed.  The portals were closed with Monocryl and Steri-Strips. A dry dressing taped at the right shoulder, and arm was placed in the sling. The patient was awakened, extubated, and taken to recovery room in stable condition.  Jenetta Loges,  PA-C was used as an Environmental consultant throughout this case was essential for help with positioning of the patient, positioning of the extremity, management of the arthroscopic equipment, tissue manipulation, suture management, wound closure, and intraoperative decision making.     Metta Clines. Shirell Struthers, M.D.     KMS/MEDQ  D:  11/02/2014  T:  11/03/2014  Job:  [id number]

## 2014-11-24 ENCOUNTER — Ambulatory Visit (AMBULATORY_SURGERY_CENTER): Payer: Self-pay

## 2014-11-24 VITALS — Ht 65.0 in | Wt 163.0 lb

## 2014-11-24 DIAGNOSIS — Z1211 Encounter for screening for malignant neoplasm of colon: Secondary | ICD-10-CM

## 2014-11-24 MED ORDER — PEG-KCL-NACL-NASULF-NA ASC-C 100 G PO SOLR: 1.0000 | Freq: Once | ORAL | Status: DC

## 2014-11-24 NOTE — Progress Notes (Signed)
Pt said she had egd/colonoscopy abt 10 yrs ago for rectal bldg. Unable to remember doc name, but said normal except for gerd and hems.     Hx of post op N/V No egg or soy allergies Pt not taking diet drugs Not on home 02 Emmi video sent to [email or web address]

## 2014-12-08 ENCOUNTER — Encounter: Payer: Self-pay | Admitting: Internal Medicine

## 2014-12-08 ENCOUNTER — Ambulatory Visit (AMBULATORY_SURGERY_CENTER): Payer: BLUE CROSS/BLUE SHIELD | Admitting: Internal Medicine

## 2014-12-08 VITALS — BP 143/62 | HR 66 | Temp 98.6°F | Resp 30 | Ht 65.0 in | Wt 163.0 lb

## 2014-12-08 DIAGNOSIS — K639 Disease of intestine, unspecified: Secondary | ICD-10-CM | POA: Diagnosis not present

## 2014-12-08 DIAGNOSIS — D122 Benign neoplasm of ascending colon: Secondary | ICD-10-CM

## 2014-12-08 DIAGNOSIS — K922 Gastrointestinal hemorrhage, unspecified: Secondary | ICD-10-CM | POA: Diagnosis not present

## 2014-12-08 DIAGNOSIS — Z1211 Encounter for screening for malignant neoplasm of colon: Secondary | ICD-10-CM

## 2014-12-08 DIAGNOSIS — D123 Benign neoplasm of transverse colon: Secondary | ICD-10-CM | POA: Diagnosis not present

## 2014-12-08 MED ORDER — SODIUM CHLORIDE 0.9 % IV SOLN: 500.0000 mL | INTRAVENOUS | Status: DC

## 2014-12-08 NOTE — Progress Notes (Signed)
Transferred to recovery room. A/O x3, pleased with MAC.  VSS.  Report to Jill, RN. 

## 2014-12-08 NOTE — Patient Instructions (Signed)
YOU HAD AN ENDOSCOPIC PROCEDURE TODAY AT [facility]:   Refer to the procedure report that was given to you for any specific questions about what was found during the examination.  If the procedure report does not answer your questions, please call your gastroenterologist to clarify.  If you requested that your care partner not be given the details of your procedure findings, then the procedure report has been included in a sealed envelope for you to review at your convenience later.  YOU SHOULD EXPECT: Some feelings of bloating in the abdomen. Passage of more gas than usual.  Walking can help get rid of the air that was put into your GI tract during the procedure and reduce the bloating. If you had a lower endoscopy (such as a colonoscopy or flexible sigmoidoscopy) you may notice spotting of blood in your stool or on the toilet paper. If you underwent a bowel prep for your procedure, you may not have a normal bowel movement for a few days.  Please Note:  You might notice some irritation and congestion in your nose or some drainage.  This is from the oxygen used during your procedure.  There is no need for concern and it should clear up in a day or so.  SYMPTOMS TO REPORT IMMEDIATELY:   Following lower endoscopy (colonoscopy or flexible sigmoidoscopy):  Excessive amounts of blood in the stool  Significant tenderness or worsening of abdominal pains  Swelling of the abdomen that is new, acute  Fever of 100F or higher   For urgent or emergent issues, a gastroenterologist can be reached at any hour by calling [phone].   DIET: Your first meal following the procedure should be a small meal and then it is ok to progress to your normal diet. Heavy or fried foods are harder to digest and may make you feel nauseous or bloated.  Likewise, meals heavy in dairy and vegetables can increase bloating.  Drink plenty of fluids but you should avoid alcoholic beverages for 24  hours.  ACTIVITY:  You should plan to take it easy for the rest of today and you should NOT DRIVE or use heavy machinery until tomorrow (because of the sedation medicines used during the test).    FOLLOW UP: Our staff will call the number listed on your records the next business day following your procedure to check on you and address any questions or concerns that you may have regarding the information given to you following your procedure. If we do not reach you, we will leave a message.  However, if you are feeling well and you are not experiencing any problems, there is no need to return our call.  We will assume that you have returned to your regular daily activities without incident.  If any biopsies were taken you will be contacted by phone or by letter within the next 1-3 weeks.  Please call us at [phone] if you have not heard about the biopsies in 3 weeks.    SIGNATURES/CONFIDENTIALITY: You and/or your care partner have signed paperwork which will be entered into your electronic medical record.  These signatures attest to the fact that that the information above on your After Visit Summary has been reviewed and is understood.  Full responsibility of the confidentiality of this discharge information lies with you and/or your care-partner.  Polyps/ diverticulosis handout given Repeat Colonoscopy in 3 years

## 2014-12-08 NOTE — Progress Notes (Signed)
Pt. With sling to right arm upon arriving in admissions.

## 2014-12-08 NOTE — Progress Notes (Signed)
Called to room to assist during endoscopic procedure.  Patient ID and intended procedure confirmed with present staff. Received instructions for my participation in the procedure from the performing physician.  

## 2014-12-08 NOTE — Op Note (Signed)
[place]  [address] [place], [address]   COLONOSCOPY PROCEDURE REPORT  PATIENT: [name], [name]  MR#: [id number] BIRTHDATE: Jan 14, 1956 , 42  yrs. old GENDER: female ENDOSCOPIST: Eustace Quail, MD REFERRED [id number] Family Practice PROCEDURE DATE:  12/08/2014 PROCEDURE:   Colonoscopy, screening, Colonoscopy with snare polypectomyX2, and Colonoscopy with biopsies First Screening Colonoscopy - Avg.  risk and is 50 yrs.  old or older Yes.  Prior Negative Screening - Now for repeat screening. N/A  History of Adenoma - Now for follow-up colonoscopy & has been > or = to 3 yrs.  N/A  Polyps removed today? Yes ASA CLASS:   Class II INDICATIONS:Screening for colonic neoplasia and Colorectal Neoplasm Risk Assessment for this procedure is average risk. MEDICATIONS: Monitored anesthesia care and Propofol 325 mg IV DESCRIPTION OF PROCEDURE:   After the risks benefits and alternatives of the procedure were thoroughly explained, informed consent was obtained.  The digital rectal exam revealed no rectal abnormalities.   The LB [id number]  endoscope was introduced through the anus and advanced to the cecum, which was identified by both the appendix and ileocecal valve. No adverse events experienced.   The quality of the prep was fair, particularly in the right colon. However, with vigorous irrigation, suctioning, and time the quality of the prep was upgraded to adequate in this region. Good prep and other portions of the colon..  (MoviPrep was used)  The instrument was then slowly withdrawn as the colon was fully examined. Estimated blood loss is zero unless otherwise noted in this procedure report.  COLON FINDINGS: Two polyps were found in the transverse colon(6 mm) and ascending colon(10 mm).  A polypectomy was performed with a cold snare.  The resection was complete, the polyp tissue was completely retrieved and sent to histology. In addition, there  was bleeding along the ileocecal valve. Likely secondary to suction trauma. This was biopsied to confirm.   There was moderate diverticulosis noted in the left colon and right colon.   The examination was otherwise normal.  Retroflexed views revealed internal hemorrhoids. The time to cecum = 1.7 Withdrawal time = 19.8   The scope was withdrawn and the procedure completed. COMPLICATIONS: There were no immediate complications.  ENDOSCOPIC IMPRESSION: 1.   Two polyps were found in the transverse colon and ascending colon; polypectomy was performed with a cold snare 2.   Moderate diverticulosis was noted in the left colon and right colon 3.   The examination was otherwise normal  RECOMMENDATIONS: 1.  Await biopsy results 2.  Repeat Colonoscopy in 3 years. Will need more vigorous preparation.  eSigned:  Eustace Quail, MD 12/08/2014 12:11 PM   cc: The Patient    ; [facility] Smitty Cords, M.D.)

## 2014-12-11 ENCOUNTER — Telehealth: Payer: Self-pay | Admitting: *Deleted

## 2014-12-11 NOTE — Telephone Encounter (Signed)
  Follow up Call-  Call back number 12/08/2014  Post procedure Call Back phone  # [phone]  Permission to leave phone message Yes     Patient questions:  Do you have a fever, pain , or abdominal swelling? No. Pain Score  0 *  Have you tolerated food without any problems? Yes.    Have you been able to return to your normal activities? Yes.    Do you have any questions about your discharge instructions: Diet   No. Medications  No. Follow up visit  No.  Do you have questions or concerns about your Care? No.  Actions: * If pain score is 4 or above: No action needed, pain <4.

## 2014-12-12 ENCOUNTER — Encounter: Payer: Self-pay | Admitting: Internal Medicine

## 2015-04-02 ENCOUNTER — Encounter: Payer: Self-pay | Admitting: Family Medicine

## 2015-04-02 ENCOUNTER — Ambulatory Visit (INDEPENDENT_AMBULATORY_CARE_PROVIDER_SITE_OTHER): Payer: BLUE CROSS/BLUE SHIELD | Admitting: Family Medicine

## 2015-04-02 VITALS — BP 173/98 | HR 85 | Temp 98.5°F | Ht 65.0 in | Wt 164.5 lb

## 2015-04-02 DIAGNOSIS — Z20828 Contact with and (suspected) exposure to other viral communicable diseases: Secondary | ICD-10-CM | POA: Diagnosis not present

## 2015-04-02 DIAGNOSIS — K1122 Acute recurrent sialoadenitis: Secondary | ICD-10-CM | POA: Diagnosis not present

## 2015-04-02 DIAGNOSIS — I10 Essential (primary) hypertension: Secondary | ICD-10-CM | POA: Diagnosis not present

## 2015-04-02 MED ORDER — VALACYCLOVIR HCL 500 MG PO TABS: 500.0000 mg | ORAL_TABLET | Freq: Two times a day (BID) | ORAL | Status: DC

## 2015-04-02 MED ORDER — AMLODIPINE BESYLATE 5 MG PO TABS: 5.0000 mg | ORAL_TABLET | Freq: Every day | ORAL | Status: DC
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
## 2015-04-02 NOTE — Patient Instructions (Addendum)
Keep well hydrated, apply moist heat to the involved area, massage the gland, and "milk" the duct. Use hard candies that make you salivate, use these throughout the day (for example, sour lemon drops). Referring to ENT doctor since this is ongoing Return if worsening in next few days here to see [place] Be seen immediately if a lot worse or fevers  Checking for herpes virus today Sent in prescription for valtrex for you to try  Refilled amlodipine. See Dr. Juanito Doom in the next couple of weeks for BP follow up  Be well, Dr. Ardelia Mems   Salivary Gland Infection A salivary gland infection is an infection in one or more of the glands that produce spit (saliva). You have six major salivary glands. Each gland has a duct that carries saliva into your mouth. Saliva keeps your mouth moist and breaks down the food that you eat. It also helps to prevent tooth decay. Two salivary glands are located just in front of your ears (parotid). The ducts for these glands open up inside your cheeks, near your back teeth. You also have two glands under your tongue (sublingual) and two glands under your jaw (submandibular). The ducts for these glands open under your tongue. Any salivary gland can become infected. Most infections occur in the parotid glands or submandibular glands. CAUSES Salivary glands can be infected by viruses or bacteria.  The mumps virus is the most common cause of viral salivary gland infections, though mumps is now rare in many areas because of vaccination.  This infection causes swelling in both parotid glands.  Viral infections are more common in children.  The bacteria that cause salivary gland infections are usually the same bacteria that normally live in your mouth.  A stone can form in a salivary gland and block the flow of saliva. As a result, saliva backs up into the salivary gland. Bacteria may then start to grow behind the blockage and cause infection.  Bacterial infections usually  cause pain and swelling on one side of the face. Submandibular gland swelling occurs under the jaw. Parotid swelling occurs in front of the ear.  Bacterial infections are more common in adults. RISK FACTORS Children who do not get the MMR (measles, mumps, rubella) vaccine are more likely to get mumps, which can cause a viral salivary gland infection. Risk factors for bacterial infections include:  Poor dental care (oral hygiene).  Smoking.  Not drinking enough water.  Having a disease that causes dry mouth and dry eyes (Mikulicz syndrome or Sjogren syndrome). SIGNS AND SYMPTOMS The main sign of salivary gland infection is a swollen salivary gland. This type of inflammation is often called sialadenitis. You may have swelling in front of your ear, under your jaw, or under your tongue. Swelling may get worse when you eat and decrease after you eat. Other signs and symptoms include:  Pain.  Tenderness.  Redness.  Dry mouth.  Bad taste in your mouth.  Difficulty chewing and swallowing.  Fever. DIAGNOSIS Your health care provider may suspect a salivary gland infection based on your signs and symptoms. He or she will also do a physical exam. The health care provider will look and feel inside your mouth to see whether a stone is blocking a salivary gland duct. You may need to see an ear, nose, and throat specialist (ENT or otolaryngologist) for diagnosis and treatment. You may also need to have diagnostic tests, such as:  An X-ray to check for a stone.  Other imaging studies to  look for an abscess and to rule out other causes of swelling. These tests may include:  Ultrasound.  CT scan.  MRI.  Culture and sensitivity test. This involves collecting a sample of pus for testing in the lab to see what bacteria grow and what antibiotics they are sensitive to. The testing sample may be:  Swabbed from a salivary gland duct.  Withdrawn from a swollen gland with a needle  (aspiration). TREATMENT Viral salivary gland infections usually clear up without treatment. Bacterial infections are usually treated with antibiotic medicine. Severe infections that cause difficulty with swallowing may be treated with an IV antibiotic in the hospital. Other treatments may include:  Probing and widening the salivary duct to allow a stone to pass. In some cases, a thin, flexible scope (endoscope) may be inserted into the duct to find a stone and remove it.  Breaking up a stone using sound waves.  Draining an infected gland (abscess) with a needle.  In some cases, you may need surgery so your health care provider can:  Remove a stone.  Drain pus from an abscess.  Remove a badly infected gland. HOME CARE INSTRUCTIONS  Take medicines only as directed by your health care provider.  If you were prescribed an antibiotic medicine, finish it all even if you start to feel better.  Follow these instructions every few hours:  Suck on a lemon candy to stimulate the flow of saliva.  Put a warm compress over the gland.  Gently massage the gland.  Drink enough fluid to keep your urine clear or pale yellow.  Rinse your mouth with a mixture of warm water and salt every few hours. To make this mixture, add a pinch of salt to 1 cup of warm water.  Practice good oral hygiene by brushing and flossing your teeth after meals and before you go to bed.  Do not use any tobacco products, including cigarettes, chewing tobacco, or electronic cigarettes. If you need help quitting, ask your health care provider. SEEK MEDICAL CARE IF:  You have pain and swelling in your face, jaw, or mouth after eating.  You have persistent swelling in any of these places:  In front of your ear.  Under your jaw.  Inside your mouth. SEEK IMMEDIATE MEDICAL CARE IF:   You have pain and swelling in your face, jaw, or mouth that are getting worse.  Your pain and swelling make it hard to swallow or  breathe.   This information is not intended to replace advice given to you by your health care provider. Make sure you discuss any questions you have with your health care provider.   Document Released: 05/15/2004 Document Revised: 04/28/2014 Document Reviewed: 09/07/2013 Elsevier Interactive Patient Education [employer].

## 2015-04-03 LAB — HSV(HERPES SIMPLEX VRS) I + II AB-IGG
HSV 1 Glycoprotein G Ab, IgG: 4.79 IV — ABNORMAL HIGH
HSV 2 GLYCOPROTEIN G AB, IGG: 6.89 IV — AB

## 2015-04-10 ENCOUNTER — Telehealth: Payer: Self-pay | Admitting: Family Medicine

## 2015-04-10 ENCOUNTER — Encounter: Payer: Self-pay | Admitting: Family Medicine

## 2015-04-10 DIAGNOSIS — A6000 Herpesviral infection of urogenital system, unspecified: Secondary | ICD-10-CM | POA: Insufficient documentation

## 2015-04-10 MED ORDER — VALACYCLOVIR HCL 1 G PO TABS: 1000.0000 mg | ORAL_TABLET | Freq: Every day | ORAL | Status: DC
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
## 2015-06-11 ENCOUNTER — Other Ambulatory Visit: Payer: Self-pay | Admitting: *Deleted

## 2015-06-11 MED ORDER — AMLODIPINE BESYLATE 5 MG PO TABS: 5.0000 mg | ORAL_TABLET | Freq: Every day | ORAL | Status: DC

## 2015-06-11 NOTE — Telephone Encounter (Signed)
Refilled Amlodipine. Called patient and informed her that she will need to come in and schedule an appointment to be seen for hypertension. She was agreeable.   Smitty Cords, MD [place], PGY-1

## 2016-01-17 ENCOUNTER — Encounter (HOSPITAL_COMMUNITY): Payer: Self-pay | Admitting: Family Medicine

## 2016-01-17 ENCOUNTER — Ambulatory Visit (HOSPITAL_COMMUNITY)
Admission: EM | Admit: 2016-01-17 | Discharge: 2016-01-17 | Disposition: A | Discharge status: Home / Self Care | Payer: Worker's Compensation | Attending: Emergency Medicine | Admitting: Emergency Medicine

## 2016-01-17 DIAGNOSIS — S8391XA Sprain of unspecified site of right knee, initial encounter: Secondary | ICD-10-CM

## 2016-01-17 DIAGNOSIS — S43402A Unspecified sprain of left shoulder joint, initial encounter: Secondary | ICD-10-CM

## 2016-01-17 DIAGNOSIS — M545 Low back pain, unspecified: Secondary | ICD-10-CM

## 2016-01-17 MED ORDER — NAPROXEN 375 MG PO TABS: 375.0000 mg | ORAL_TABLET | Freq: Two times a day (BID) | ORAL | 0 refills | Status: DC

## 2016-01-17 MED ORDER — TRAMADOL HCL 50 MG PO TABS: 50.0000 mg | ORAL_TABLET | Freq: Four times a day (QID) | ORAL | 0 refills | Status: DC | PRN
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
## 2016-01-17 NOTE — Discharge Instructions (Signed)
Use ice to the shoulder and right knee. Recommend use heat to the right low back muscles. Wear the left arm sling for about 3 days. He may remove your arm periodically to move it around. After 3 days remove it to see how the shoulder does. If he continued to have pain especially worsening pain and limited range of motion after 3-4 days follow-up with her primary care doctor. Wear the knee brace on your right knee for 3-5 days. Limit walking for the next 2 or 3 days. You will be sore and places tomorrow that you are not sore today. He will have increased discomfort in your joints tomorrow. 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

## 2016-01-18 ENCOUNTER — Ambulatory Visit (INDEPENDENT_AMBULATORY_CARE_PROVIDER_SITE_OTHER): Payer: BLUE CROSS/BLUE SHIELD | Admitting: Family Medicine

## 2016-01-18 VITALS — BP 139/87 | HR 75 | Temp 98.1°F | Wt 169.0 lb

## 2016-01-18 DIAGNOSIS — I10 Essential (primary) hypertension: Secondary | ICD-10-CM

## 2016-01-18 DIAGNOSIS — R7989 Other specified abnormal findings of blood chemistry: Secondary | ICD-10-CM

## 2016-01-18 DIAGNOSIS — Z23 Encounter for immunization: Secondary | ICD-10-CM | POA: Diagnosis not present

## 2016-01-18 DIAGNOSIS — Z0000 Encounter for general adult medical examination without abnormal findings: Secondary | ICD-10-CM

## 2016-01-18 LAB — CBC WITH DIFFERENTIAL/PLATELET
Basophils Absolute: 69 cells/uL (ref 0–200)
Basophils Relative: 1 %
EOS ABS: 138 {cells}/uL (ref 15–500)
Eosinophils Relative: 2 %
HEMATOCRIT: 39.2 % (ref 35.0–45.0)
HEMOGLOBIN: 13 g/dL (ref 11.7–15.5)
LYMPHS ABS: 2829 {cells}/uL (ref 850–3900)
Lymphocytes Relative: 41 %
MCH: 28.3 pg (ref 27.0–33.0)
MCHC: 33.2 g/dL (ref 32.0–36.0)
MCV: 85.4 fL (ref 80.0–100.0)
MONO ABS: 552 {cells}/uL (ref 200–950)
MPV: 9.9 fL (ref 7.5–12.5)
Monocytes Relative: 8 %
Neutro Abs: 3312 cells/uL (ref 1500–7800)
Neutrophils Relative %: 48 %
Platelets: 378 10*3/uL (ref 140–400)
RBC: 4.59 MIL/uL (ref 3.80–5.10)
RDW: 13.8 % (ref 11.0–15.0)
WBC: 6.9 10*3/uL (ref 3.8–10.8)

## 2016-01-18 LAB — COMPLETE METABOLIC PANEL WITH GFR
ALBUMIN: 4.4 g/dL (ref 3.6–5.1)
ALK PHOS: 68 U/L (ref 33–130)
ALT: 13 U/L (ref 6–29)
AST: 20 U/L (ref 10–35)
BUN: 29 mg/dL — ABNORMAL HIGH (ref 7–25)
CALCIUM: 9.9 mg/dL (ref 8.6–10.4)
CO2: 25 mmol/L (ref 20–31)
Chloride: 102 mmol/L (ref 98–110)
Creat: 1.41 mg/dL — ABNORMAL HIGH (ref 0.50–1.05)
GFR, EST AFRICAN AMERICAN: 47 mL/min — AB (ref >=60)
GFR, EST NON AFRICAN AMERICAN: 41 mL/min — AB (ref >=60)
Glucose, Bld: 80 mg/dL (ref 65–99)
POTASSIUM: 4.9 mmol/L (ref 3.5–5.3)
Sodium: 139 mmol/L (ref 135–146)
Total Bilirubin: 0.4 mg/dL (ref 0.2–1.2)
Total Protein: 7.6 g/dL (ref 6.1–8.1)

## 2016-01-18 LAB — TSH: TSH: 0.95 mIU/L

## 2016-01-18 LAB — LIPID PANEL
CHOL/HDL RATIO: 4.3 ratio (ref <=5.0)
CHOLESTEROL: 256 mg/dL — AB (ref 125–200)
HDL: 60 mg/dL (ref >=46)
LDL Cholesterol: 165 mg/dL — ABNORMAL HIGH (ref <130)
TRIGLYCERIDES: 153 mg/dL — AB (ref <150)
VLDL: 31 mg/dL — ABNORMAL HIGH (ref <30)

## 2016-01-18 NOTE — Patient Instructions (Signed)
Thank you for coming in today, it was so nice to see you! Today we talked about:    Kidneys: We will do some blood work today. Please make an appointment with [facility]  Flu shot today  We ordered a bunch of labs as well   Please follow up in 6 months. You can schedule this appointment at the front desk before you leave or call the clinic.  Bring in all your medications or supplements to each appointment for review.   If we ordered any tests today, you will be notified via telephone of any abnormalities. If everything is normal you will get a letter in the mail.   If you have any questions or concerns, please do not hesitate to call the office at [phone]. You can also message me directly via MyChart.   Sincerely,  Smitty Cords, MD
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
## 2016-01-19 LAB — VITAMIN D 25 HYDROXY (VIT D DEFICIENCY, FRACTURES): Vit D, 25-Hydroxy: 24 ng/mL — ABNORMAL LOW (ref 30–100)

## 2016-01-19 LAB — HEPATITIS C ANTIBODY: HCV Ab: NEGATIVE

## 2016-01-19 LAB — HIV ANTIBODY (ROUTINE TESTING W REFLEX): HIV: NONREACTIVE

## 2016-01-23 ENCOUNTER — Encounter: Payer: Self-pay | Admitting: Family Medicine

## 2016-01-23 NOTE — Addendum Note (Signed)
Addended by: Carlyle Dolly on: 01/23/2016 11:00 PM   Modules accepted: Orders

## 2016-01-25 ENCOUNTER — Ambulatory Visit (INDEPENDENT_AMBULATORY_CARE_PROVIDER_SITE_OTHER): Payer: Worker's Compensation

## 2016-01-25 ENCOUNTER — Ambulatory Visit (INDEPENDENT_AMBULATORY_CARE_PROVIDER_SITE_OTHER): Payer: Worker's Compensation | Admitting: Physician Assistant

## 2016-01-25 VITALS — BP 122/80 | HR 83 | Temp 98.6°F | Resp 16 | Ht 65.0 in | Wt 168.0 lb

## 2016-01-25 DIAGNOSIS — M25551 Pain in right hip: Secondary | ICD-10-CM | POA: Diagnosis not present

## 2016-01-25 DIAGNOSIS — M25561 Pain in right knee: Secondary | ICD-10-CM | POA: Diagnosis not present

## 2016-01-25 DIAGNOSIS — M25512 Pain in left shoulder: Secondary | ICD-10-CM

## 2016-01-25 MED ORDER — CYCLOBENZAPRINE HCL 10 MG PO TABS: 5.0000 mg | ORAL_TABLET | Freq: Three times a day (TID) | ORAL | 0 refills | Status: DC | PRN

## 2016-01-25 NOTE — Patient Instructions (Signed)
If you are not feeling much better in one week then please call so I can refer you to a physical therapist.
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
## 2016-02-04 ENCOUNTER — Other Ambulatory Visit: Payer: Self-pay | Admitting: Family Medicine

## 2016-02-08 ENCOUNTER — Other Ambulatory Visit: Payer: Self-pay | Admitting: Nephrology

## 2016-02-08 DIAGNOSIS — N183 Chronic kidney disease, stage 3 unspecified: Secondary | ICD-10-CM

## 2016-02-11 ENCOUNTER — Ambulatory Visit (INDEPENDENT_AMBULATORY_CARE_PROVIDER_SITE_OTHER): Payer: Worker's Compensation | Admitting: Family Medicine

## 2016-02-11 VITALS — BP 124/82 | HR 97 | Temp 98.4°F | Resp 18 | Ht 65.0 in | Wt 167.0 lb

## 2016-02-11 DIAGNOSIS — M25561 Pain in right knee: Secondary | ICD-10-CM

## 2016-02-11 DIAGNOSIS — M25512 Pain in left shoulder: Secondary | ICD-10-CM

## 2016-02-11 DIAGNOSIS — M25552 Pain in left hip: Secondary | ICD-10-CM

## 2016-02-11 MED ORDER — PREDNISONE 20 MG PO TABS: ORAL_TABLET | ORAL | 0 refills | Status: DC

## 2016-02-11 NOTE — Patient Instructions (Addendum)
Take Prednisone 20 mg,  in mornings with breakfast as follows:  Take 3 pills for 3 days, Take 2 pills for 3 days, and Take 1 pill for 3 days.  Complete all medication.  Ambulatory referral to physical therapy and they will contact you to schedule an appointment.  IF you received an x-ray today, you will receive an invoice from [facility]. Please contact [facility] at [phone] with questions or concerns regarding your invoice.   IF you received labwork today, you will receive an invoice from [employer]. Please contact [facility] at [phone] with questions or concerns regarding your invoice.   Our billing staff will not be able to assist you with questions regarding bills from these companies.  You will be contacted with the lab results as soon as they are available. The fastest way to get your results is to activate your My Chart account. Instructions are located on the last page of this paperwork. If you have not heard from [place] regarding the results in 2 weeks, please contact this office.
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
## 2016-02-14 ENCOUNTER — Other Ambulatory Visit (HOSPITAL_COMMUNITY): Payer: Self-pay | Admitting: Nephrology

## 2016-02-14 ENCOUNTER — Other Ambulatory Visit: Payer: Self-pay | Admitting: Nephrology

## 2016-02-14 DIAGNOSIS — N183 Chronic kidney disease, stage 3 unspecified: Secondary | ICD-10-CM

## 2016-02-15 ENCOUNTER — Ambulatory Visit
Admission: RE | Admit: 2016-02-15 | Discharge: 2016-02-15 | Disposition: A | Discharge status: Home / Self Care | Payer: BLUE CROSS/BLUE SHIELD | Source: Ambulatory Visit | Attending: Nephrology | Admitting: Nephrology

## 2016-02-15 DIAGNOSIS — N183 Chronic kidney disease, stage 3 unspecified: Secondary | ICD-10-CM

## 2016-04-08 ENCOUNTER — Ambulatory Visit (INDEPENDENT_AMBULATORY_CARE_PROVIDER_SITE_OTHER): Payer: Worker's Compensation | Admitting: Family Medicine

## 2016-04-08 ENCOUNTER — Other Ambulatory Visit: Payer: Self-pay | Admitting: Radiology

## 2016-04-08 VITALS — BP 130/80 | HR 82 | Temp 98.6°F | Resp 17 | Ht 65.0 in | Wt 167.0 lb

## 2016-04-08 DIAGNOSIS — M25512 Pain in left shoulder: Secondary | ICD-10-CM

## 2016-04-08 DIAGNOSIS — M25552 Pain in left hip: Secondary | ICD-10-CM

## 2016-04-08 DIAGNOSIS — M25561 Pain in right knee: Secondary | ICD-10-CM

## 2016-04-08 NOTE — Patient Instructions (Signed)
     IF you received an x-ray today, you will receive an invoice from [facility]. Please contact [facility] at [phone] with questions or concerns regarding your invoice.   IF you received labwork today, you will receive an invoice from [facility]. Please contact [facility] at [phone] with questions or concerns regarding your invoice.   Our billing staff will not be able to assist you with questions regarding bills from these companies.  You will be contacted with the lab results as soon as they are available. The fastest way to get your results is to activate your My Chart account. Instructions are located on the last page of this paperwork. If you have not heard from us regarding the results in 2 weeks, please contact this office.     
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
## 2016-04-09 ENCOUNTER — Other Ambulatory Visit: Payer: Self-pay | Admitting: General Surgery

## 2016-04-10 ENCOUNTER — Ambulatory Visit (HOSPITAL_COMMUNITY)
Admission: RE | Admit: 2016-04-10 | Discharge: 2016-04-10 | Disposition: A | Discharge status: Home / Self Care | Payer: BLUE CROSS/BLUE SHIELD | Source: Ambulatory Visit | Attending: Nephrology | Admitting: Nephrology

## 2016-04-10 DIAGNOSIS — I129 Hypertensive chronic kidney disease with stage 1 through stage 4 chronic kidney disease, or unspecified chronic kidney disease: Secondary | ICD-10-CM | POA: Diagnosis not present

## 2016-04-10 DIAGNOSIS — N183 Chronic kidney disease, stage 3 unspecified: Secondary | ICD-10-CM

## 2016-04-10 LAB — CBC
HCT: 36.9 % (ref 36.0–46.0)
Hemoglobin: 12.5 g/dL (ref 12.0–15.0)
MCH: 28.6 pg (ref 26.0–34.0)
MCHC: 33.9 g/dL (ref 30.0–36.0)
MCV: 84.4 fL (ref 78.0–100.0)
PLATELETS: 338 10*3/uL (ref 150–400)
RBC: 4.37 MIL/uL (ref 3.87–5.11)
RDW: 12.1 % (ref 11.5–15.5)
WBC: 6.1 10*3/uL (ref 4.0–10.5)

## 2016-04-10 LAB — PROTIME-INR
INR: 0.96
PROTHROMBIN TIME: 12.7 s (ref 11.4–15.2)

## 2016-04-10 LAB — APTT: aPTT: 30 seconds (ref 24–36)

## 2016-04-10 MED ORDER — FENTANYL CITRATE (PF) 100 MCG/2ML IJ SOLN
INTRAMUSCULAR | Status: AC | PRN
  Administered 2016-04-10: 25 ug via INTRAVENOUS
  Administered 2016-04-10: 50 ug via INTRAVENOUS

## 2016-04-10 MED ORDER — MIDAZOLAM HCL 2 MG/2ML IJ SOLN
INTRAMUSCULAR | Status: AC | PRN
  Administered 2016-04-10: 1 mg via INTRAVENOUS
  Administered 2016-04-10: 0.5 mg via INTRAVENOUS

## 2016-04-10 MED ORDER — LIDOCAINE HCL (PF) 1 % IJ SOLN
INTRAMUSCULAR | Status: AC
  Filled 2016-04-10: qty 10

## 2016-04-10 MED ORDER — SODIUM CHLORIDE 0.9 % IV SOLN: INTRAVENOUS | Status: DC

## 2016-04-10 MED ORDER — FENTANYL CITRATE (PF) 100 MCG/2ML IJ SOLN
INTRAMUSCULAR | Status: AC
  Filled 2016-04-10: qty 2

## 2016-04-10 MED ORDER — MIDAZOLAM HCL 2 MG/2ML IJ SOLN
INTRAMUSCULAR | Status: AC
  Filled 2016-04-10: qty 2

## 2016-04-10 NOTE — H&P (Signed)
Chief Complaint: Patient was seen in consultation today for random renal biopsy  Referring Physician(s):  Dr. Madelon Lips  Supervising Physician: Arne Cleveland  Patient Status: [facility] - Out-pt  History of Present Illness: [name] is a 60 y.o. female with past medical history of thyroid nodule, GERD, dysrhytmia, arthritis, and chronic kidney disease stage G3a/A3 with decreased GFR who presents for a random renal biopsy for further evaluation of her kidney disease at the request of Dr. Madelon Lips.  Case reviewed by Dr. Barbie Banner who feels patient is appropriate for procedure.   Patient has been NPO.  She is not on any blood thinners.   Past Medical History:  Diagnosis Date  . Arthritis   . Complication of anesthesia   . Dysrhythmia    PALPITATIONS WITH CAFFEINE/ CHOC  . GERD (gastroesophageal reflux disease)   . History of hiatal hernia   . MIGRAINE, UNSPEC., W/O INTRACTABLE MIGRAINE 06/18/2006   Qualifier: Diagnosis of  By: Darylene Price MD, Elta Guadeloupe    . OSTEOPENIA 06/18/2006   Qualifier: Diagnosis of  By: Darylene Price MD, Elta Guadeloupe    . Osteoporosis    osteopenia  . PONV (postoperative nausea and vomiting)   . RENAL INSUFFICIENCY, CHRONIC 06/18/2006   Qualifier: Diagnosis of  By: Darylene Price MD, Elta Guadeloupe    . Thyroid nodule     Past Surgical History:  Procedure Laterality Date  . APPENDECTOMY    . FINGER FRACTURE SURGERY    . LAPAROSCOPIC ENDOMETRIOSIS FULGURATION     LASER TO REMOVE BLOOD  . NOSE SURGERY     BROKEN  . SHOULDER ARTHROSCOPY WITH SUBACROMIAL DECOMPRESSION, ROTATOR CUFF REPAIR AND BICEP TENDON REPAIR Right 11/02/2014   Procedure: RIGHT SHOULDER ARTHROSCOPY WITH SUBACROMIAL DECOMPRESSION, DISTAL CLAVICLE RESECTION, ROTATOR CUFF REPAIR ;  Surgeon: Justice Britain, MD;  Location: [facility];  Service: Orthopedics;  Laterality: Right;    Allergies: Sulfamethoxazole  Medications: Prior to Admission medications   Medication Sig Start Date End Date Taking? Authorizing Provider    Omega-3 Fatty Acids (FISH OIL) 1000 MG CAPS Take 1 capsule by mouth daily.   Yes Historical Provider, MD  ranitidine (ZANTAC) 150 MG tablet Take 150 mg by mouth daily.   Yes Historical Provider, MD  cyclobenzaprine (FLEXERIL) 10 MG tablet Take 0.5-1 tablets (5-10 mg total) by mouth 3 (three) times daily as needed for muscle spasms. Patient not taking: Reported on 04/08/2016 01/25/16   Tereasa Coop, PA-C     Family History  Problem Relation Age of Onset  . Kidney disease Mother   . Kidney disease Maternal Uncle   . Kidney disease Cousin   . Colon cancer Neg Hx     Social History   Social History  . Marital status: Single    Spouse name: N/A  . Number of children: N/A  . Years of education: N/A   Occupational History  . 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 Smoking status: Never Smoker  . Smokeless tobacco: Never Used  . Alcohol use No  . Drug use: No  . Sexual activity: Not on file   Other Topics Concern  . Not on file   Social History Narrative  . No narrative on file    Review of Systems  Constitutional: Negative for chills and fever.  Respiratory: Negative for cough and shortness of breath.   Cardiovascular: Negative for chest pain.  Gastrointestinal: Negative for abdominal pain.  Psychiatric/Behavioral: Negative for behavioral problems and confusion.  Vital Signs: BP 131/77   Pulse 68   Temp 98.2 F (36.8 C) (Oral)   Resp 18   Ht 5\' 5"  (1.651 m)   Wt 167 lb (75.8 kg)   SpO2 99%   BMI 27.79 kg/m   Physical Exam  Constitutional: She is oriented to person, place, and time. She appears well-developed. No distress.  Cardiovascular: Normal rate, regular rhythm and normal heart sounds.   Pulmonary/Chest: Effort normal and breath sounds normal. No respiratory distress.  Abdominal: Soft.  Neurological: She is alert and oriented to person, place, and time.  Skin: Skin is warm and dry.  Psychiatric: She has a normal  mood and affect. Her behavior is normal. Judgment and thought content normal.  Nursing note and vitals reviewed.   Mallampati Score:  MD Evaluation Airway: WNL Heart: WNL Abdomen: WNL Chest/ Lungs: WNL ASA  Classification: 3 Mallampati/Airway Score: Two  Imaging: No results found.  Labs:  CBC:  Recent Labs  01/18/16 1158 04/10/16 0559  WBC 6.9 6.1  HGB 13.0 12.5  HCT 39.2 36.9  PLT 378 338    COAGS:  Recent Labs  04/10/16 0559  INR 0.96  APTT 30    BMP:  Recent Labs  01/18/16 1158  NA 139  K 4.9  CL 102  CO2 25  GLUCOSE 80  BUN 29*  CALCIUM 9.9  CREATININE 1.41*  GFRNONAA 41*  GFRAA 47*    LIVER FUNCTION TESTS:  Recent Labs  01/18/16 1158  BILITOT 0.4  AST 20  ALT 13  ALKPHOS 68  PROT 7.6  ALBUMIN 4.4    TUMOR MARKERS: No results for input(s): AFPTM, CEA, CA199, CHROMGRNA in the last 8760 hours.  Assessment and Plan: Patient with significant history of chronic kidney disease presents for random renal biopsy at the request of Dr. Madelon Lips.   Patient is scheduled for procedure today.  She has been NPO and is not on any blood thinners.  Risks and benefits discussed with the patient including, but not limited to bleeding, infection, damage to adjacent structures or low yield requiring additional tests. All of the patient's questions were answered, patient is agreeable to proceed. Consent signed and in chart.  Thank you for this interesting consult.  I greatly enjoyed meeting JAKAYLEE SASAKI and look forward to participating in their care.  A copy of this report was sent to the requesting provider on this date.  Electronically Signed: Docia Barrier 04/10/2016, 7:40 AM   I spent a total of  30 Minutes   in face to face in clinical consultation, greater than 50% of which was counseling/coordinating care for chronic kidney disease.

## 2016-04-10 NOTE — Sedation Documentation (Signed)
Called to give report. Nurse unavailable. Will call back 

## 2016-04-10 NOTE — Procedures (Signed)
[place] random renal core bx RLP 16g x3 to surg path No complication No blood loss. See complete dictation in [facility]

## 2016-04-10 NOTE — Discharge Instructions (Signed)
Percutaneous Kidney Biopsy, Care After °This sheet gives you information about how to care for yourself after your procedure. Your health care provider may also give you more specific instructions. If you have problems or questions, contact your health care provider. °What can I expect after the procedure? °After the procedure, it is common to have: °· Pain or soreness near the area where the needle went through your skin (biopsy site). °· Bright pink or cloudy urine for 24 hours after the procedure. °Follow these instructions at home: °Activity  °· Return to your normal activities as told by your health care provider. Ask your health care provider what activities are safe for you. °· Do not drive for 24 hours if you were given a medicine to help you relax (sedative). °· Do not lift anything that is heavier than 10 lb (4.5 kg) until your health care provider tells you that it is safe. °· Avoid activities that take a lot of effort (are strenuous) until your health care provider approves. Most people will have to wait 2 weeks before returning to activities such as exercise or sexual intercourse. °General instructions  °· Take over-the-counter and prescription medicines only as told by your health care provider. °· You may eat and drink after your procedure. Follow instructions from your health care provider about eating or drinking restrictions. °· Check your biopsy site every day for signs of infection. Check for: °¨ More redness, swelling, or pain. °¨ More fluid or blood. °¨ Warmth. °¨ Pus or a bad smell. °· Keep all follow-up visits as told by your health care provider. This is important. °Contact a health care provider if: °· You have more redness, swelling, or pain around your biopsy site. °· You have more fluid or blood coming from your biopsy site. °· Your biopsy site feels warm to the touch. °· You have pus or a bad smell coming from your biopsy site. °· You have blood in your urine more than 24 hours after  your procedure. °Get help right away if: °· You have dark red or brown urine. °· You have a fever. °· You are unable to urinate. °· You feel burning when you urinate. °· You feel faint. °· You have severe pain in your abdomen or side. °This information is not intended to replace advice given to you by your health care provider. Make sure you discuss any questions you have with your health care provider. °Document Released: 12/08/2012 Document Revised: 01/18/2016 Document Reviewed: 01/18/2016 °Elsevier Interactive Patient Education © 2017 Elsevier Inc. ° °

## 2016-04-10 NOTE — Progress Notes (Addendum)
Patty RN radiology gave report, pt started bedrest @0730 . Time was clarified, bedrest began at [facility]

## 2016-04-16 ENCOUNTER — Other Ambulatory Visit: Payer: Self-pay | Admitting: *Deleted

## 2016-04-16 MED ORDER — AMLODIPINE BESYLATE 5 MG PO TABS: 5.0000 mg | ORAL_TABLET | Freq: Every day | ORAL | 1 refills | Status: DC

## 2016-04-25 ENCOUNTER — Encounter (HOSPITAL_COMMUNITY): Payer: Self-pay

## 2016-05-06 NOTE — Progress Notes (Signed)
Note reviewed, and agree with documentation and plan.

## 2016-05-09 NOTE — Progress Notes (Signed)
Patient ID: [name], female   DOB: 1955/11/06, 61 y.o.   MRN: [id number] Reviewed documentation and agree w/ assessment and plan. Delman Cheadle, MD MPH Delman Cheadle, M.D. [facility] [address] [place], [place] [address] [phone] phone [phone] fax  05/09/16 8:13 AM

## 2016-05-21 ENCOUNTER — Encounter (INDEPENDENT_AMBULATORY_CARE_PROVIDER_SITE_OTHER): Payer: Self-pay | Admitting: Ophthalmology

## 2016-06-11 ENCOUNTER — Other Ambulatory Visit: Payer: Self-pay | Admitting: Family Medicine

## 2016-07-17 ENCOUNTER — Ambulatory Visit (INDEPENDENT_AMBULATORY_CARE_PROVIDER_SITE_OTHER): Payer: Worker's Compensation

## 2016-07-17 ENCOUNTER — Ambulatory Visit (INDEPENDENT_AMBULATORY_CARE_PROVIDER_SITE_OTHER): Payer: Worker's Compensation | Admitting: Physician Assistant

## 2016-07-17 VITALS — BP 104/69 | HR 76 | Temp 98.7°F | Resp 18 | Ht 65.0 in | Wt 166.6 lb

## 2016-07-17 DIAGNOSIS — M545 Low back pain, unspecified: Secondary | ICD-10-CM

## 2016-07-17 DIAGNOSIS — G8929 Other chronic pain: Secondary | ICD-10-CM

## 2016-07-17 MED ORDER — TRAMADOL-ACETAMINOPHEN 37.5-325 MG PO TABS: 1.0000 | ORAL_TABLET | Freq: Four times a day (QID) | ORAL | 0 refills | Status: DC | PRN

## 2016-07-17 MED ORDER — CYCLOBENZAPRINE HCL 10 MG PO TABS: 5.0000 mg | ORAL_TABLET | Freq: Three times a day (TID) | ORAL | 0 refills | Status: DC | PRN
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
## 2016-07-17 NOTE — Patient Instructions (Signed)
     IF you received an x-ray today, you will receive an invoice from [facility]. Please contact [facility] at [phone] with questions or concerns regarding your invoice.   IF you received labwork today, you will receive an invoice from [facility]. Please contact [facility] at [phone] with questions or concerns regarding your invoice.   Our billing staff will not be able to assist you with questions regarding bills from these companies.  You will be contacted with the lab results as soon as they are available. The fastest way to get your results is to activate your My Chart account. Instructions are located on the last page of this paperwork. If you have not heard from us regarding the results in 2 weeks, please contact this office.     

## 2016-07-28 ENCOUNTER — Telehealth: Payer: Self-pay | Admitting: Family Medicine

## 2016-07-28 NOTE — Telephone Encounter (Signed)
[name] a request for pt OV on 04-08-2016 and 07-17-16 to them there fax number is [phone] Attn: Pattricia Boss

## 2016-07-28 NOTE — Telephone Encounter (Signed)
Did receive fax and will be working on it as soon as possible

## 2016-08-19 ENCOUNTER — Telehealth: Payer: Self-pay | Admitting: Physician Assistant

## 2016-08-20 NOTE — Telephone Encounter (Signed)
Opened in error.  Philis Fendt, MS, PA-C 10:22 AM, 08/20/2016

## 2016-10-28 ENCOUNTER — Encounter (INDEPENDENT_AMBULATORY_CARE_PROVIDER_SITE_OTHER): Payer: BLUE CROSS/BLUE SHIELD | Admitting: Ophthalmology

## 2016-10-28 DIAGNOSIS — I10 Essential (primary) hypertension: Secondary | ICD-10-CM

## 2016-10-28 DIAGNOSIS — H43813 Vitreous degeneration, bilateral: Secondary | ICD-10-CM

## 2016-10-28 DIAGNOSIS — H353131 Nonexudative age-related macular degeneration, bilateral, early dry stage: Secondary | ICD-10-CM

## 2016-10-28 DIAGNOSIS — H2513 Age-related nuclear cataract, bilateral: Secondary | ICD-10-CM

## 2016-10-28 DIAGNOSIS — H35033 Hypertensive retinopathy, bilateral: Secondary | ICD-10-CM | POA: Diagnosis not present

## 2016-10-30 ENCOUNTER — Other Ambulatory Visit: Payer: Self-pay | Admitting: Neurosurgery

## 2016-10-30 DIAGNOSIS — M5416 Radiculopathy, lumbar region: Secondary | ICD-10-CM

## 2016-11-12 ENCOUNTER — Ambulatory Visit
Admission: RE | Admit: 2016-11-12 | Discharge: 2016-11-12 | Disposition: A | Discharge status: Home / Self Care | Payer: Worker's Compensation | Source: Ambulatory Visit | Attending: Neurosurgery | Admitting: Neurosurgery

## 2016-11-12 DIAGNOSIS — M5416 Radiculopathy, lumbar region: Secondary | ICD-10-CM

## 2017-03-25 ENCOUNTER — Encounter: Payer: Self-pay | Admitting: Family Medicine

## 2017-03-25 DIAGNOSIS — N1832 Chronic kidney disease, stage 3b: Secondary | ICD-10-CM | POA: Insufficient documentation

## 2017-03-25 DIAGNOSIS — N184 Chronic kidney disease, stage 4 (severe): Secondary | ICD-10-CM | POA: Insufficient documentation

## 2017-03-25 DIAGNOSIS — N183 Chronic kidney disease, stage 3 (moderate): Secondary | ICD-10-CM

## 2017-03-25 NOTE — Progress Notes (Signed)
Subjective:  [name] is a 61 y.o. year old female with PMH of Hematuria and CKD stage 3, GERD, osteopenia, HA, multiple thyroid nodules who presents to office today for an annual physical examination.  Concerns today include:  1. Voice hoarseness: Patient endorses voice hoarseness since 2002. She notes that she has been seen by [facility] ENT in the past and was told there was nothing else to do. She endorses some intermittent pain on the R side of her neck.  2. GERD: Patient has been experiencing heartburn for many year(s), chronic and constant over time. ROS: patient denies abdominal pain, chest pain, cough, wheezing, dysphagia, black stools, diarrhea or constipation. Social history: no or minimal alcohol, nonsmoker, no or mild caffeine use, no ASA or NSAID's.  General Healthcare: Medication Compliance: yes Dx Hypertension: No  Dx Hyperlipidemia: No Diabetes: No Dx Obesity: No Weight Loss: No Physical Activity: walks intermittently  Urinary Incontinence: No  Menstrual hx: post menopausal  Last dental exam: last year   Social:  reports that  has never smoked. she has never used smokeless tobacco. 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 MAMMOGRAM  09/24/2016  Getting mammogram in 2 weeks   Past Medical History Past Medical History:  Diagnosis Date  . Arthritis   . Complication of anesthesia   . Dysrhythmia    PALPITATIONS  WITH CAFFEINE/ CHOC  . GERD (gastroesophageal reflux disease)   . History of hiatal hernia   . MIGRAINE, UNSPEC., W/O INTRACTABLE MIGRAINE 06/18/2006   Qualifier: Diagnosis of  By: Darylene Price MD, Elta Guadeloupe    . OSTEOPENIA 06/18/2006   Qualifier: Diagnosis of  By: Darylene Price MD, Elta Guadeloupe    . Osteoporosis    osteopenia  . PONV (postoperative nausea and vomiting)   . RENAL INSUFFICIENCY, CHRONIC 06/18/2006   Qualifier: Diagnosis of  By: Darylene Price MD, Elta Guadeloupe    . Thyroid nodule    Patient Active Problem List   Diagnosis Date Noted  . Essential hypertension 03/27/2017  . Hyperlipidemia 03/27/2017  . CKD stage G3b/A3, GFR 30-44 and albumin creatinine ratio >300 mg/g (HCC) 03/25/2017  . Genital herpes 04/10/2015  . Rotator cuff syndrome 08/30/2014  . Strain of iliopsoas muscle 07/31/2014  . Edema 12/22/2012  . Multiple thyroid nodules 04/15/2012  . Routine medical exam 03/17/2012  . Hematuria 01/08/2012  . Renal cyst 01/08/2012  . Headache(784.0) 12/30/2011  . Hypercalcemia 02/14/2011  . NECK PAIN, RIGHT 04/25/2010  . CALF CRAMPS, NOCTURNAL 12/05/2009  . PALPITATIONS, RECURRENT 07/16/2007  . GASTROESOPHAGEAL REFLUX, NO ESOPHAGITIS 06/18/2006  . RENAL INSUFFICIENCY, CHRONIC 06/18/2006  . OSTEOPENIA 06/18/2006    Medications- reviewed and updated Current Outpatient Medications  Medication Sig Dispense Refill  . amLODipine (NORVASC) 5 MG tablet take 1 tablet by mouth once daily 30 tablet 1  . Omega-3 Fatty Acids (FISH OIL) 1000 MG CAPS Take 1 capsule by mouth daily.    . pantoprazole (PROTONIX) 40 MG tablet Take 1 tablet (40  mg total) by mouth daily. 30 tablet 3   No current facility-administered medications for this visit.     Objective: BP (!) 154/86   Pulse 79   Temp 98.4 F (36.9 C) (Oral)   Ht 5\' 5"  (1.651 m)   Wt 167 lb 12.8 oz (76.1 kg)   SpO2 98%   BMI 27.92 kg/m  Gen: In no acute distress, alert, cooperative with exam, well groomed HEENT: NCAT, EOMI, PERRL CV: Regular rate and  rhythm, normal S1/S2, no murmur Resp: Clear to auscultation bilaterally, no wheezes, non-labored Abd: Soft, Non Tender, Non Distended, bowel sounds present, no guarding or organomegaly Ext: No edema, warm and well perfused Neuro: Alert and oriented, No gross deficits, normal gait Psych: Normal mood and affect   Assessment/Plan:  GASTROESOPHAGEAL REFLUX, NO ESOPHAGITIS Hx of GERD, uncontrolled with OTC H2 blocker. Could be cause of voice hoarseness. Voice did not sound hoarse on exam today however.  - Protonix - If she continues to have hoarseness despite PPI, may consider referral to ENT   Essential hypertension Uncontrolled today.  BP 154/86 initially and same on repeat check.  Patient states BP has been normal at home.  Could possibly be whitecoat hypertension vs worsening hypertension due to CKD stage III.   -Encouraged patient to check blood pressure daily at home and discussed if above 140/90 then to make an appointment -Continue amlodipine 5 mg daily -CMP today  Routine medical exam Patient doing well overall.  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  Flu shot provided today schedule mammogram in 2 weeks.   -follow up in 1 year for next well adult visit  Hyperlipidemia History of hypercholesterolemia based off of previous labs. -Recheck lipid panel   Orders Placed This Encounter  Procedures  . Flu Vaccine QUAD 36+ mos IM  . Lipid Panel    Order Specific Question:   Has the patient fasted?    Answer:   No  . CBC with Differential  . Comprehensive metabolic panel    Order Specific Question:   Has the patient fasted?    Answer:   No    Meds ordered this encounter  Medications  . pantoprazole (PROTONIX) 40 MG tablet    Sig: Take 1 tablet (40 mg total) by mouth daily.    Dispense:  30 tablet    Refill:  3     Smitty Cords, MD [place], PGY-3

## 2017-03-26 ENCOUNTER — Encounter: Payer: Self-pay | Admitting: Family Medicine

## 2017-03-26 ENCOUNTER — Ambulatory Visit (INDEPENDENT_AMBULATORY_CARE_PROVIDER_SITE_OTHER): Payer: BLUE CROSS/BLUE SHIELD | Admitting: Family Medicine

## 2017-03-26 ENCOUNTER — Other Ambulatory Visit: Payer: Self-pay

## 2017-03-26 VITALS — BP 154/86 | HR 79 | Temp 98.4°F | Ht 65.0 in | Wt 167.8 lb

## 2017-03-26 DIAGNOSIS — E785 Hyperlipidemia, unspecified: Secondary | ICD-10-CM

## 2017-03-26 DIAGNOSIS — K219 Gastro-esophageal reflux disease without esophagitis: Secondary | ICD-10-CM

## 2017-03-26 DIAGNOSIS — I10 Essential (primary) hypertension: Secondary | ICD-10-CM

## 2017-03-26 DIAGNOSIS — Z23 Encounter for immunization: Secondary | ICD-10-CM | POA: Diagnosis not present

## 2017-03-26 DIAGNOSIS — Z0000 Encounter for general adult medical examination without abnormal findings: Secondary | ICD-10-CM

## 2017-03-26 MED ORDER — PANTOPRAZOLE SODIUM 40 MG PO TBEC: 40.0000 mg | DELAYED_RELEASE_TABLET | Freq: Every day | ORAL | 3 refills | Status: DC

## 2017-03-26 NOTE — Patient Instructions (Addendum)
Thank you for coming in today, it was so nice to see you! Today we talked about:    Annual physical: You are doing great!  You got a flu shot today  Continue taking your medications as prescribed  I will need to look into your chart to see if we need to refer you again to ENT  Acid reflux: Start taking protonix 1 pill daily, stop taking ranitidine  Blood pressure: Your blood pressure is high today. Our goal is for the top number to be under 140 and bottom to be under 90. Please continue checking your pressure at home, if it is higher than this at home then please call and make an appointment with me so we can adjust your blood pressure medication  Please follow up in 1 year for your next physical or sooner if needed for your blood pressure.   Bring in all your medications or supplements to each appointment for review.   If we ordered any tests today, you will be notified via telephone of any abnormalities. If everything is normal you will get a letter in the mail.   If you have any questions or concerns, please do not hesitate to call the office at [phone]. You can also message me directly via MyChart.   Sincerely,  Smitty Cords, MD

## 2017-03-27 ENCOUNTER — Encounter: Payer: Self-pay | Admitting: Family Medicine

## 2017-03-27 DIAGNOSIS — I10 Essential (primary) hypertension: Secondary | ICD-10-CM | POA: Insufficient documentation

## 2017-03-27 DIAGNOSIS — E785 Hyperlipidemia, unspecified: Secondary | ICD-10-CM | POA: Insufficient documentation

## 2017-03-27 LAB — CBC WITH DIFFERENTIAL/PLATELET
BASOS: 1 %
Basophils Absolute: 0.1 10*3/uL (ref 0.0–0.2)
EOS (ABSOLUTE): 0.1 10*3/uL (ref 0.0–0.4)
EOS: 2 %
Hematocrit: 37.5 % (ref 34.0–46.6)
Hemoglobin: 12.3 g/dL (ref 11.1–15.9)
IMMATURE GRANS (ABS): 0 10*3/uL (ref 0.0–0.1)
Immature Granulocytes: 0 %
LYMPHS ABS: 2.1 10*3/uL (ref 0.7–3.1)
Lymphs: 41 %
MCH: 27.9 pg (ref 26.6–33.0)
MCHC: 32.8 g/dL (ref 31.5–35.7)
MCV: 85 fL (ref 79–97)
MONOCYTES: 9 %
MONOS ABS: 0.5 10*3/uL (ref 0.1–0.9)
NEUTROS ABS: 2.4 10*3/uL (ref 1.4–7.0)
NEUTROS PCT: 47 %
PLATELETS: 371 10*3/uL (ref 150–379)
RBC: 4.41 x10E6/uL (ref 3.77–5.28)
RDW: 13.5 % (ref 12.3–15.4)
WBC: 5.2 10*3/uL (ref 3.4–10.8)

## 2017-03-27 LAB — LIPID PANEL
CHOL/HDL RATIO: 3.9 ratio (ref 0.0–4.4)
Cholesterol, Total: 220 mg/dL — ABNORMAL HIGH (ref 100–199)
HDL: 56 mg/dL (ref >39)
LDL Calculated: 126 mg/dL — ABNORMAL HIGH (ref 0–99)
Triglycerides: 191 mg/dL — ABNORMAL HIGH (ref 0–149)
VLDL Cholesterol Cal: 38 mg/dL (ref 5–40)

## 2017-03-27 LAB — COMPREHENSIVE METABOLIC PANEL
A/G RATIO: 1.5 (ref 1.2–2.2)
ALBUMIN: 4.1 g/dL (ref 3.6–4.8)
ALT: 10 IU/L (ref 0–32)
AST: 18 IU/L (ref 0–40)
Alkaline Phosphatase: 75 IU/L (ref 39–117)
BILIRUBIN TOTAL: 0.2 mg/dL (ref 0.0–1.2)
BUN / CREAT RATIO: 14 (ref 12–28)
BUN: 22 mg/dL (ref 8–27)
CHLORIDE: 104 mmol/L (ref 96–106)
CO2: 23 mmol/L (ref 20–29)
Calcium: 9.8 mg/dL (ref 8.7–10.3)
Creatinine, Ser: 1.53 mg/dL — ABNORMAL HIGH (ref 0.57–1.00)
GFR calc non Af Amer: 37 mL/min/{1.73_m2} — ABNORMAL LOW (ref >59)
GFR, EST AFRICAN AMERICAN: 42 mL/min/{1.73_m2} — AB (ref >59)
Globulin, Total: 2.7 g/dL (ref 1.5–4.5)
Glucose: 89 mg/dL (ref 65–99)
POTASSIUM: 5.2 mmol/L (ref 3.5–5.2)
Sodium: 141 mmol/L (ref 134–144)
TOTAL PROTEIN: 6.8 g/dL (ref 6.0–8.5)

## 2017-03-27 NOTE — Assessment & Plan Note (Signed)
Hx of GERD, uncontrolled with OTC H2 blocker. Could be cause of voice hoarseness. Voice did not sound hoarse on exam today however.  - Protonix - If she continues to have hoarseness despite PPI, may consider referral to ENT

## 2017-03-27 NOTE — Assessment & Plan Note (Signed)
History of hypercholesterolemia based off of previous labs. -Recheck lipid panel

## 2017-03-27 NOTE — Assessment & Plan Note (Addendum)
Uncontrolled today.  BP 154/86 initially and same on repeat check.  Patient states BP has been normal at home.  Could possibly be whitecoat hypertension vs worsening hypertension due to CKD stage III.   -Encouraged patient to check blood pressure daily at home and discussed if above 140/90 then to make an appointment -Continue amlodipine 5 mg daily -CMP today

## 2017-03-27 NOTE — Assessment & Plan Note (Signed)
Patient doing well overall.  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  Flu shot provided today schedule mammogram in 2 weeks.   -follow up in 1 year for next well adult visit

## 2017-04-01 ENCOUNTER — Telehealth: Payer: Self-pay | Admitting: Family Medicine

## 2017-04-01 MED ORDER — ATORVASTATIN CALCIUM 40 MG PO TABS: 40.0000 mg | ORAL_TABLET | Freq: Every day | ORAL | 3 refills | Status: DC

## 2017-04-01 NOTE — Telephone Encounter (Signed)
Called patient and let her know her cholesterol was high.  The 10-year ASCVD risk score Mikey Bussing DC Brooke Bonito., et al., 2013) is: 12.4%   Values used to calculate the score:     Age: 61 years     Sex: Female     Is Non-Hispanic African American: Yes     Diabetic: No     Tobacco smoker: No     Systolic Blood Pressure: 276 mmHg     Is BP treated: Yes     HDL Cholesterol: 56 mg/dL     Total Cholesterol: 220 mg/dL  Recommended starting statin and diet modification. Patient does endorse she eats lots of fried meats. She will try and work on decreasing this. Rx for Atorvastatin sent to pharmacy. She was appreciative of the call.   Smitty Cords, MD [place], PGY-3

## 2017-07-26 ENCOUNTER — Other Ambulatory Visit: Payer: Self-pay | Admitting: Family Medicine

## 2017-08-21 ENCOUNTER — Other Ambulatory Visit: Payer: Self-pay | Admitting: Family Medicine

## 2017-09-21 ENCOUNTER — Other Ambulatory Visit: Payer: Self-pay | Admitting: Family Medicine

## 2017-10-19 ENCOUNTER — Other Ambulatory Visit: Payer: Self-pay | Admitting: Family Medicine

## 2017-11-16 ENCOUNTER — Other Ambulatory Visit: Payer: Self-pay | Admitting: Family Medicine

## 2017-11-26 ENCOUNTER — Encounter: Payer: Self-pay | Admitting: Internal Medicine

## 2017-12-10 ENCOUNTER — Other Ambulatory Visit: Payer: Self-pay | Admitting: Neurosurgery

## 2017-12-10 DIAGNOSIS — M5416 Radiculopathy, lumbar region: Secondary | ICD-10-CM

## 2017-12-13 ENCOUNTER — Other Ambulatory Visit: Payer: Self-pay | Admitting: Family Medicine

## 2017-12-20 ENCOUNTER — Ambulatory Visit
Admission: RE | Admit: 2017-12-20 | Discharge: 2017-12-20 | Disposition: A | Discharge status: Home / Self Care | Payer: Worker's Compensation | Source: Ambulatory Visit | Attending: Neurosurgery | Admitting: Neurosurgery

## 2017-12-20 DIAGNOSIS — M5416 Radiculopathy, lumbar region: Secondary | ICD-10-CM

## 2018-01-20 ENCOUNTER — Encounter: Payer: Self-pay | Admitting: Internal Medicine

## 2018-03-03 ENCOUNTER — Encounter: Payer: Self-pay | Admitting: Internal Medicine

## 2018-03-03 ENCOUNTER — Ambulatory Visit (AMBULATORY_SURGERY_CENTER): Payer: Self-pay | Admitting: *Deleted

## 2018-03-03 VITALS — Ht 65.0 in | Wt 160.0 lb

## 2018-03-03 DIAGNOSIS — Z86010 Personal history of colonic polyps: Secondary | ICD-10-CM

## 2018-03-03 MED ORDER — NA SULFATE-K SULFATE-MG SULF 17.5-3.13-1.6 GM/177ML PO SOLN
1.0000 | Freq: Once | ORAL | 0 refills | Status: AC
Start: 2018-03-03 — End: 2018-03-03

## 2018-03-03 NOTE — Progress Notes (Signed)
No egg or soy allergy known to patient  No issues with past sedation with any surgeries  or procedures, no intubation problems  No diet pills per patient No home 02 use per patient  No blood thinners per patient  Pt denies issues with constipation  No A fib or A flutter  EMMI video sent to pt's e mail  Suprep pay no more than $50 coupon to pt

## 2018-03-17 ENCOUNTER — Encounter: Payer: Self-pay | Admitting: Internal Medicine

## 2018-03-17 ENCOUNTER — Ambulatory Visit (AMBULATORY_SURGERY_CENTER): Payer: Managed Care, Other (non HMO) | Admitting: Internal Medicine

## 2018-03-17 VITALS — BP 145/79 | HR 64 | Temp 97.8°F | Resp 17 | Ht 65.0 in | Wt 167.0 lb

## 2018-03-17 DIAGNOSIS — D123 Benign neoplasm of transverse colon: Secondary | ICD-10-CM | POA: Diagnosis not present

## 2018-03-17 DIAGNOSIS — D120 Benign neoplasm of cecum: Secondary | ICD-10-CM

## 2018-03-17 DIAGNOSIS — Z86010 Personal history of colonic polyps: Secondary | ICD-10-CM

## 2018-03-17 DIAGNOSIS — D122 Benign neoplasm of ascending colon: Secondary | ICD-10-CM

## 2018-03-17 HISTORY — PX: COLONOSCOPY: SHX174

## 2018-03-17 MED ORDER — SODIUM CHLORIDE 0.9 % IV SOLN: 500.0000 mL | Freq: Once | INTRAVENOUS | Status: DC

## 2018-03-17 NOTE — Progress Notes (Signed)
Pt's states no medical or surgical changes since previsit or office visit. 

## 2018-03-17 NOTE — Patient Instructions (Signed)
INFORMATION ON POLYPS GIVEN.   YOU HAD AN ENDOSCOPIC PROCEDURE TODAY AT [facility]:   Refer to the procedure report that was given to you for any specific questions about what was found during the examination.  If the procedure report does not answer your questions, please call your gastroenterologist to clarify.  If you requested that your care partner not be given the details of your procedure findings, then the procedure report has been included in a sealed envelope for you to review at your convenience later.  YOU SHOULD EXPECT: Some feelings of bloating in the abdomen. Passage of more gas than usual.  Walking can help get rid of the air that was put into your GI tract during the procedure and reduce the bloating. If you had a lower endoscopy (such as a colonoscopy or flexible sigmoidoscopy) you may notice spotting of blood in your stool or on the toilet paper. If you underwent a bowel prep for your procedure, you may not have a normal bowel movement for a few days.  Please Note:  You might notice some irritation and congestion in your nose or some drainage.  This is from the oxygen used during your procedure.  There is no need for concern and it should clear up in a day or so.  SYMPTOMS TO REPORT IMMEDIATELY:   Following lower endoscopy (colonoscopy or flexible sigmoidoscopy):  Excessive amounts of blood in the stool  Significant tenderness or worsening of abdominal pains  Swelling of the abdomen that is new, acute  Fever of 100F or higher    For urgent or emergent issues, a gastroenterologist can be reached at any hour by calling [phone].   DIET:  We do recommend a small meal at first, but then you may proceed to your regular diet.  Drink plenty of fluids but you should avoid alcoholic beverages for 24 hours.  ACTIVITY:  You should plan to take it easy for the rest of today and you should NOT DRIVE or use heavy machinery until tomorrow (because of the sedation  medicines used during the test).    FOLLOW UP: Our staff will call the number listed on your records the next business day following your procedure to check on you and address any questions or concerns that you may have regarding the information given to you following your procedure. If we do not reach you, we will leave a message.  However, if you are feeling well and you are not experiencing any problems, there is no need to return our call.  We will assume that you have returned to your regular daily activities without incident.  If any biopsies were taken you will be contacted by phone or by letter within the next 1-3 weeks.  Please call us at [phone] if you have not heard about the biopsies in 3 weeks.    SIGNATURES/CONFIDENTIALITY: You and/or your care partner have signed paperwork which will be entered into your electronic medical record.  These signatures attest to the fact that that the information above on your After Visit Summary has been reviewed and is understood.  Full responsibility of the confidentiality of this discharge information lies with you and/or your care-partner.

## 2018-03-17 NOTE — Progress Notes (Signed)
Report to PACU, RN, vss, BBS= Clear.  

## 2018-03-17 NOTE — Op Note (Signed)
[facility] Patient Name: [name] Procedure Date: 03/17/2018 9:09 AM MRN: [id number] Endoscopist: Docia Chuck. Henrene Pastor , MD Age: 62 Referring MD:  Date of Birth: 20-May-1955 Gender: Female Account #: [id number] Procedure:                Colonoscopy with cold snare polypectomy x 6 Indications:              High risk colon cancer surveillance: Personal                            history of adenoma (10 mm or greater in size), High                            risk colon cancer surveillance: Personal history of                            adenoma with villous component. Index examination                            2016 Medicines:                Monitored Anesthesia Care Procedure:                Pre-Anesthesia Assessment:                           - Prior to the procedure, a History and Physical                            was performed, and patient medications and                            allergies were reviewed. The patient's tolerance of                            previous anesthesia was also reviewed. The risks                            and benefits of the procedure and the sedation                            options and risks were discussed with the patient.                            All questions were answered, and informed consent                            was obtained. Prior Anticoagulants: The patient has                            taken no previous anticoagulant or antiplatelet                            agents. ASA Grade Assessment: II - A patient with  mild systemic disease. After reviewing the risks                            and benefits, the patient was deemed in                            satisfactory condition to undergo the procedure.                           After obtaining informed consent, the colonoscope                            was passed under direct vision. Throughout the                            procedure, the patient's  blood pressure, pulse, and                            oxygen saturations were monitored continuously. The                            Model CF-HQ190L [id number]) scope was introduced                            through the anus and advanced to the the cecum,                            identified by appendiceal orifice and ileocecal                            valve. The ileocecal valve, appendiceal orifice,                            and rectum were photographed. The quality of the                            bowel preparation was good. The colonoscopy was                            performed without difficulty. The patient tolerated                            the procedure well. The bowel preparation used was                            SUPREP plus magnesium citrate prior. Scope In: 9:19:19 AM Scope Out: 9:37:24 AM Scope Withdrawal Time: 0 hours 15 minutes 24 seconds  Total Procedure Duration: 0 hours 18 minutes 5 seconds  Findings:                 Six polyps were found in the transverse colon,                            ascending colon and cecum. The polyps were 2  to 5                            mm in size. These polyps were removed with a cold                            snare. Resection and retrieval were complete.                           Multiple small and large-mouthed diverticula were                            found in the left colon and right colon.                           The entire examined colon appeared normal on direct                            and retroflexion views. Complications:            No immediate complications. Estimated blood loss:                            None. Estimated Blood Loss:     Estimated blood loss: none. Estimated blood loss:                            none. Impression:               - Six 2 to 5 mm polyps in the transverse colon, in                            the ascending colon and in the cecum, removed with                            a cold snare.  Resected and retrieved.                           - Diverticulosis in the left colon and in the right                            colon.                           - The entire examined colon is normal on direct and                            retroflexion views. Recommendation:           - Repeat colonoscopy in 3 years for surveillance.                           - Patient has a contact number available for                            emergencies. The signs  and symptoms of potential                            delayed complications were discussed with the                            patient. Return to normal activities tomorrow.                            Written discharge instructions were provided to the                            patient.                           - Resume previous diet.                           - Continue present medications.                           - Continue present medications.                           - Await pathology results. Docia Chuck. Henrene Pastor, MD 03/17/2018 9:52:30 AM This report has been signed electronically.

## 2018-03-17 NOTE — Progress Notes (Signed)
Called to room to assist during endoscopic procedure.  Patient ID and intended procedure confirmed with present staff. Received instructions for my participation in the procedure from the performing physician.  

## 2018-03-22 ENCOUNTER — Telehealth: Payer: Self-pay | Admitting: *Deleted

## 2018-03-22 ENCOUNTER — Telehealth: Payer: Self-pay

## 2018-03-22 NOTE — Telephone Encounter (Signed)
Message left, no answer for first call for follow up.

## 2018-03-22 NOTE — Telephone Encounter (Signed)
  Follow up Call-  Call back number 03/17/2018  Post procedure Call Back phone  # [phone]  Permission to leave phone message Yes  Some recent data might be hidden     Patient questions:  Do you have a fever, pain , or abdominal swelling? No. Pain Score  0 *  Have you tolerated food without any problems? Yes.    Have you been able to return to your normal activities? Yes.    Do you have any questions about your discharge instructions: Diet   No. Medications  No. Follow up visit  No.  Do you have questions or concerns about your Care? No.  Actions: * If pain score is 4 or above: No action needed, pain <4.

## 2018-03-24 ENCOUNTER — Encounter: Payer: Self-pay | Admitting: Internal Medicine

## 2018-04-09 ENCOUNTER — Ambulatory Visit (INDEPENDENT_AMBULATORY_CARE_PROVIDER_SITE_OTHER): Payer: Managed Care, Other (non HMO) | Admitting: Family Medicine

## 2018-04-09 VITALS — BP 122/80 | HR 89 | Temp 97.8°F | Wt 158.2 lb

## 2018-04-09 DIAGNOSIS — R229 Localized swelling, mass and lump, unspecified: Secondary | ICD-10-CM

## 2018-04-09 DIAGNOSIS — M25551 Pain in right hip: Secondary | ICD-10-CM

## 2018-04-09 DIAGNOSIS — Z23 Encounter for immunization: Secondary | ICD-10-CM | POA: Diagnosis not present

## 2018-04-09 NOTE — Patient Instructions (Signed)
It was great seeing you today! I am sorry you have been having hip pain. I think getting some xray to help me figure out if the pain is coming from your back or your hip will be really helpful. Regarding that nodule on your hip. I think seeing dermatology clinic to possibly have that biopsied is a good idea. On your way out the door please schedule an appointment with dermatology clinic/

## 2018-04-12 ENCOUNTER — Other Ambulatory Visit: Payer: Self-pay | Admitting: Nephrology

## 2018-04-12 ENCOUNTER — Encounter: Payer: Self-pay | Admitting: Family Medicine

## 2018-04-12 DIAGNOSIS — Z23 Encounter for immunization: Secondary | ICD-10-CM

## 2018-04-12 DIAGNOSIS — R229 Localized swelling, mass and lump, unspecified: Secondary | ICD-10-CM | POA: Insufficient documentation

## 2018-04-12 DIAGNOSIS — N183 Chronic kidney disease, stage 3 unspecified: Secondary | ICD-10-CM

## 2018-04-12 DIAGNOSIS — M25551 Pain in right hip: Secondary | ICD-10-CM

## 2018-04-12 HISTORY — DX: Encounter for immunization: Z23

## 2018-04-12 HISTORY — DX: Localized swelling, mass and lump, unspecified: R22.9

## 2018-04-12 HISTORY — DX: Pain in right hip: M25.551

## 2018-04-12 NOTE — Assessment & Plan Note (Signed)
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  Will get right hip x-ray to evaluate

## 2018-04-12 NOTE — Progress Notes (Signed)
   HPI 62 year old female who presents for flu shot and to have spot on right hip looked at.  Patient states that she has had the spot for a long time but it has been changing slowly.  Was looked at by another provider and that individual told her there was nothing to do "a couple years ago".  She is concerned because it seems to be growing and is intermittently becoming sore.  She has not tried anything to relieve the pain.  She has not noticed any surrounding erythema, bleeding, or purulent fluid coming from spot ever.  It has not gotten any more hyperpigmented or hypopigmented since starting.  Patient also feels that she is been having some right hip pain.  She states started a few weeks ago.  Is an intermittently sharp but usually achy pain that comes and goes.  She is now try to think really the pain.  CC: Flu shot and spot on right hip   ROS:  Review of Systems See HPI for ROS.   CC, SH/smoking status, and VS noted  Objective: BP 122/80   Pulse 89   Temp 97.8 F (36.6 C) (Oral)   Wt 158 lb 3.2 oz (71.8 kg)   SpO2 99%   BMI 26.33 kg/m  Gen: 62 year old African-American female, resting comfortably in bed, no acute distress CV: RRR, no murmur Resp: CTAB, no wheezes, non-labored Neuro: Alert and oriented, Speech clear, No gross deficits Right hip: 2 cm x 3 cm nodular structure in right hip.  Rubbery feel, fixed in place.  Hyperpigmented, nonhomogenous imitation.  Well-circumscribed.  Unable to palpate true depth but does extend deep at least 3 cm.      Assessment and plan:  Pain of right hip joint Symptoms likely consistent with osteoarthritis of right hip.  Will get right hip x-ray to evaluate  Skin nodule Slowly progressing skin nodule with some concerning features.  Not homogenously pigmented and not completely circular.  Nodular structure does go deep at least 3 cm from skin.  Getting hip x-ray for unrelated right hip pain.  Offered patient options to have looked at  in [facility] dermatology clinic, referral to dermatologist, or watch and wait.  Patient opted to follow-up in [facility] dermatology clinic.   Orders Placed This Encounter  Procedures  . DG HIP UNILAT W OR W/O PELVIS MIN 4 VIEWS RIGHT    Standing Status:   Future    Standing Expiration Date:   06/11/2019    Order Specific Question:   Reason for Exam (SYMPTOM  OR DIAGNOSIS REQUIRED)    Answer:   right hip pain    Order Specific Question:   Preferred imaging location?    Answer:   [id number]    Order Specific Question:   Radiology Contrast Protocol - do NOT remove file path    Answer:   \\[email or web address]  . Flu Vaccine QUAD 36+ mos IM    No orders of the defined types were placed in this encounter.    Guadalupe Dawn MD PGY-2 Family Medicine Resident  04/12/2018 10:02 AM

## 2018-04-12 NOTE — Assessment & Plan Note (Signed)
Slowly progressing skin nodule with some concerning features.  Not homogenously pigmented and not completely circular.  Nodular structure does go deep at least 3 cm from skin.  Getting hip x-ray for unrelated right hip pain.  Offered patient options to have looked at in [facility] dermatology clinic, referral to dermatologist, or watch and wait.  Patient opted to follow-up in [facility] dermatology clinic.

## 2018-04-19 ENCOUNTER — Ambulatory Visit
Admission: RE | Admit: 2018-04-19 | Discharge: 2018-04-19 | Disposition: A | Discharge status: Home / Self Care | Payer: Managed Care, Other (non HMO) | Source: Ambulatory Visit | Attending: Nephrology | Admitting: Nephrology

## 2018-04-19 DIAGNOSIS — N183 Chronic kidney disease, stage 3 unspecified: Secondary | ICD-10-CM

## 2018-05-13 ENCOUNTER — Ambulatory Visit (INDEPENDENT_AMBULATORY_CARE_PROVIDER_SITE_OTHER): Payer: Managed Care, Other (non HMO) | Admitting: Family Medicine

## 2018-05-13 VITALS — BP 140/88 | HR 74 | Temp 98.8°F | Wt 160.0 lb

## 2018-05-13 DIAGNOSIS — D239 Other benign neoplasm of skin, unspecified: Secondary | ICD-10-CM | POA: Diagnosis not present

## 2018-05-13 NOTE — Progress Notes (Addendum)
  Subjective:     Patient ID: [name], female   DOB: 29-Dec-1955, 63 y.o.   MRN: [id number]  HPI Lesion: Notice a right thigh lesion which started in April 2019. Lesion is becoming painful lately and getting darker in color. It has also increased in size. No itching. No treatment tried so far. She will like to get it out.  Current Outpatient Medications on File Prior to Visit  Medication Sig Dispense Refill  . losartan (COZAAR) 25 MG tablet Take 25 mg by mouth daily.    . magnesium citrate SOLN Take 1 Bottle by mouth once. For a 2 day bowel prep per Dr Henrene Pastor 11-27    . Omega-3 Fatty Acids (FISH OIL) 1000 MG CAPS Take 1 capsule by mouth daily.    . pantoprazole (PROTONIX) 40 MG tablet TAKE 1 TABLET BY MOUTH EVERY DAY 90 tablet 3   No current facility-administered medications on file prior to visit.    Past Medical History:  Diagnosis Date  . Allergy   . Anemia   . Arthritis   . Blood transfusion without reported diagnosis   . Complication of anesthesia   . Dysrhythmia    PALPITATIONS WITH CAFFEINE/ CHOC  . GERD (gastroesophageal reflux disease)   . Heart murmur   . History of hiatal hernia   . Hyperlipidemia    past hx   . Hypertension   . MIGRAINE, UNSPEC., W/O INTRACTABLE MIGRAINE 06/18/2006   Qualifier: Diagnosis of  By: Darylene Price MD, Elta Guadeloupe    . OSTEOPENIA 06/18/2006   Qualifier: Diagnosis of  By: Darylene Price MD, Elta Guadeloupe    . Osteoporosis    osteopenia  . PONV (postoperative nausea and vomiting)   . RENAL INSUFFICIENCY, CHRONIC 06/18/2006   Qualifier: Diagnosis of  By: Darylene Price MD, Elta Guadeloupe    . Thyroid nodule    Vitals:   05/13/18 1605  BP: 140/88  Pulse: 74  Temp: 98.8 F (37.1 C)  TempSrc: Oral  Weight: 160 lb (72.6 kg)     Review of Systems  Skin: Positive for color change.       Skin lesion  All other systems reviewed and are negative.      Objective:   Physical Exam Vitals signs and nursing note reviewed.  Constitutional:      Appearance: She is not  ill-appearing.  Skin:      Neurological:     Mental Status: She is alert.        Assessment:     Skin lesion. Looks more like dermatofibroma. She is concern about cancer. Melanoma less likely. We will excise lesion and send for pathology.   Both informed written and verbal consent were obtained. Plan:     PRE-OP DIAGNOSIS: Dermatofibroma  POST-OP DIAGNOSIS: Same   PROCEDURE: skin lesion excision  Performing Physician: Andrena Mews, MD, MPH   PROCEDURE:  Excision Biopsy      The area surrounding the skin lesion was prepared and draped in the usual sterile manner. 13cc 2% Lidocaine with epi was infiltrated around the lesion till anesthesia was achieved.The lesion was removed in the usual manner by the biopsy method noted above. Hemostasis was assured.   Closure:    5 stitches/ suture     Followup: The patient tolerated the procedure well without complications.  Standard post-procedure care is explained and return precautions are given. Return in 5-7 days for wound check and suture removal.

## 2018-05-13 NOTE — Patient Instructions (Signed)
Please return in 5-7 days for wound check and suture removal.  Wound Care, Adult Taking care of your wound properly can help to prevent pain, infection, and scarring. It can also help your wound to heal more quickly. How to care for your wound Wound care      Follow instructions from your health care provider about how to take care of your wound. Make sure you: ? Wash your hands with soap and water before you change the bandage (dressing). If soap and water are not available, use hand sanitizer. ? Change your dressing as told by your health care provider. ? Leave stitches (sutures), skin glue, or adhesive strips in place. These skin closures may need to stay in place for 2 weeks or longer. If adhesive strip edges start to loosen and curl up, you may trim the loose edges. Do not remove adhesive strips completely unless your health care provider tells you to do that.  Check your wound area every day for signs of infection. Check for: ? Redness, swelling, or pain. ? Fluid or blood. ? Warmth. ? Pus or a bad smell.  Ask your health care provider if you should clean the wound with mild soap and water. Doing this may include: ? Using a clean towel to pat the wound dry after cleaning it. Do not rub or scrub the wound. ? Applying a cream or ointment. Do this only as told by your health care provider. ? Covering the incision with a clean dressing.  Ask your health care provider when you can leave the wound uncovered.  Keep the dressing dry until your health care provider says it can be removed. Do not take baths, swim, use a hot tub, or do anything that would put the wound underwater until your health care provider approves. Ask your health care provider if you can take showers. You may only be allowed to take sponge baths. Medicines   If you were prescribed an antibiotic medicine, cream, or ointment, take or use the antibiotic as told by your health care provider. Do not stop taking or using  the antibiotic even if your condition improves.  Take over-the-counter and prescription medicines only as told by your health care provider. If you were prescribed pain medicine, take it 30 or more minutes before you do any wound care or as told by your health care provider. General instructions  Return to your normal activities as told by your health care provider. Ask your health care provider what activities are safe.  Do not scratch or pick at the wound.  Do not use any products that contain nicotine or tobacco, such as cigarettes and e-cigarettes. These may delay wound healing. If you need help quitting, ask your health care provider.  Keep all follow-up visits as told by your health care provider. This is important.  Eat a diet that includes protein, vitamin A, vitamin C, and other nutrient-rich foods to help the wound heal. ? Foods rich in protein include meat, dairy, beans, nuts, and other sources. ? Foods rich in vitamin A include carrots and dark green, leafy vegetables. ? Foods rich in vitamin C include citrus, tomatoes, and other fruits and vegetables. ? Nutrient-rich foods have protein, carbohydrates, fat, vitamins, or minerals. Eat a variety of healthy foods including vegetables, fruits, and whole grains. Contact a health care provider if:  You received a tetanus shot and you have swelling, severe pain, redness, or bleeding at the injection site.  Your pain is not controlled with  medicine.  You have redness, swelling, or pain around the wound.  You have fluid or blood coming from the wound.  Your wound feels warm to the touch.  You have pus or a bad smell coming from the wound.  You have a fever or chills.  You are nauseous or you vomit.  You are dizzy. Get help right away if:  You have a red streak going away from your wound.  The edges of the wound open up and separate.  Your wound is bleeding, and the bleeding does not stop with gentle pressure.  You have  a rash.  You faint.  You have trouble breathing. Summary  Always wash your hands with soap and water before changing your bandage (dressing).  To help with healing, eat foods that are rich in protein, vitamin A, vitamin C, and other nutrients.  Check your wound every day for signs of infection. Contact your health care provider if you suspect that your wound is infected. This information is not intended to replace advice given to you by your health care provider. Make sure you discuss any questions you have with your health care provider. Document Released: 01/15/2008 Document Revised: 05/19/2017 Document Reviewed: 10/23/2015 Elsevier Interactive Patient Education  2019 [employer].

## 2018-05-18 ENCOUNTER — Telehealth: Payer: Self-pay | Admitting: Family Medicine

## 2018-05-18 NOTE — Telephone Encounter (Signed)
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 Also to discuss the margin.  Message left for the pathologist to call me back. I will follow.

## 2018-05-18 NOTE — Telephone Encounter (Signed)
Benign cyst discussed with her.  I explained what deep margin involvement means.  Cyst could potentially grow back. She will monitor for now without repeat surgery since it is benign.  Wound and suture removal appointment scheduled.

## 2018-05-20 ENCOUNTER — Ambulatory Visit (INDEPENDENT_AMBULATORY_CARE_PROVIDER_SITE_OTHER): Payer: Managed Care, Other (non HMO) | Admitting: *Deleted

## 2018-05-20 ENCOUNTER — Ambulatory Visit: Payer: Managed Care, Other (non HMO) | Admitting: Family Medicine

## 2018-05-20 DIAGNOSIS — D239 Other benign neoplasm of skin, unspecified: Secondary | ICD-10-CM

## 2018-05-20 DIAGNOSIS — T8130XA Disruption of wound, unspecified, initial encounter: Secondary | ICD-10-CM

## 2018-05-20 MED ORDER — DOXYCYCLINE HYCLATE 100 MG PO TABS: 100.0000 mg | ORAL_TABLET | Freq: Two times a day (BID) | ORAL | 0 refills | Status: AC

## 2018-05-20 NOTE — Patient Instructions (Signed)
Verbal wound care instruction given to her before she left.   Wound Care, Adult Taking care of your wound properly can help to prevent pain, infection, and scarring. It can also help your wound to heal more quickly. How to care for your wound Wound care      Follow instructions from your health care provider about how to take care of your wound. Make sure you: ? Wash your hands with soap and water before you change the bandage (dressing). If soap and water are not available, use hand sanitizer. ? Change your dressing as told by your health care provider. ? Leave stitches (sutures), skin glue, or adhesive strips in place. These skin closures may need to stay in place for 2 weeks or longer. If adhesive strip edges start to loosen and curl up, you may trim the loose edges. Do not remove adhesive strips completely unless your health care provider tells you to do that.  Check your wound area every day for signs of infection. Check for: ? Redness, swelling, or pain. ? Fluid or blood. ? Warmth. ? Pus or a bad smell.  Ask your health care provider if you should clean the wound with mild soap and water. Doing this may include: ? Using a clean towel to pat the wound dry after cleaning it. Do not rub or scrub the wound. ? Applying a cream or ointment. Do this only as told by your health care provider. ? Covering the incision with a clean dressing.  Ask your health care provider when you can leave the wound uncovered.  Keep the dressing dry until your health care provider says it can be removed. Do not take baths, swim, use a hot tub, or do anything that would put the wound underwater until your health care provider approves. Ask your health care provider if you can take showers. You may only be allowed to take sponge baths. Medicines   If you were prescribed an antibiotic medicine, cream, or ointment, take or use the antibiotic as told by your health care provider. Do not stop taking or using  the antibiotic even if your condition improves.  Take over-the-counter and prescription medicines only as told by your health care provider. If you were prescribed pain medicine, take it 30 or more minutes before you do any wound care or as told by your health care provider. General instructions  Return to your normal activities as told by your health care provider. Ask your health care provider what activities are safe.  Do not scratch or pick at the wound.  Do not use any products that contain nicotine or tobacco, such as cigarettes and e-cigarettes. These may delay wound healing. If you need help quitting, ask your health care provider.  Keep all follow-up visits as told by your health care provider. This is important.  Eat a diet that includes protein, vitamin A, vitamin C, and other nutrient-rich foods to help the wound heal. ? Foods rich in protein include meat, dairy, beans, nuts, and other sources. ? Foods rich in vitamin A include carrots and dark green, leafy vegetables. ? Foods rich in vitamin C include citrus, tomatoes, and other fruits and vegetables. ? Nutrient-rich foods have protein, carbohydrates, fat, vitamins, or minerals. Eat a variety of healthy foods including vegetables, fruits, and whole grains. Contact a health care provider if:  You received a tetanus shot and you have swelling, severe pain, redness, or bleeding at the injection site.  Your pain is not controlled with  medicine.  You have redness, swelling, or pain around the wound.  You have fluid or blood coming from the wound.  Your wound feels warm to the touch.  You have pus or a bad smell coming from the wound.  You have a fever or chills.  You are nauseous or you vomit.  You are dizzy. Get help right away if:  You have a red streak going away from your wound.  The edges of the wound open up and separate.  Your wound is bleeding, and the bleeding does not stop with gentle pressure.  You have  a rash.  You faint.  You have trouble breathing. Summary  Always wash your hands with soap and water before changing your bandage (dressing).  To help with healing, eat foods that are rich in protein, vitamin A, vitamin C, and other nutrients.  Check your wound every day for signs of infection. Contact your health care provider if you suspect that your wound is infected. This information is not intended to replace advice given to you by your health care provider. Make sure you discuss any questions you have with your health care provider. Document Released: 01/15/2008 Document Revised: 05/19/2017 Document Reviewed: 10/23/2015 Elsevier Interactive Patient Education  2019 [employer].

## 2018-05-20 NOTE — Progress Notes (Signed)
Pt is here today for a suture removal.  5 were placed in derm clinic on 05/13/18.  Wound healing well with minimal redness around sutures.  Pt states that it is tender when clothes touch it.    Given patients complaint had Dr. Gwendlyn Deutscher assess wound. Ok with removal.  5 sutures removed with minimal effort.  Pt tolerated well and has no complaints.  After pt left, she returned and states that when she sat down she felt the wound pull and felt blood run down her leg.  When observed the wound had opened back up.   Dr. Gwendlyn Deutscher resutured.   See next note.   Triaged pt with provider and assisted with resuturing (total 45 minutes for entire time spent with patient.)  Pt will followup in nurse visit in 2 weeks per Dr. Gwendlyn Deutscher. Ravinder Hofland, Salome Spotted, CMA

## 2018-05-20 NOTE — Progress Notes (Addendum)
Patient returned for suture removal after 7 days. Wound appears clean and well apposed. Suture removed in the RN clinic.  Later patient returned that her wound opened up while she tired to sit down. No other concern.    Procedure Name: Laceration Repair   Indication: Reduce risk of infection, allow proper wound healing  Location:Right lateral thigh  Pre-Procedure Diagnosis: Wound dehiscence  Post-Procedure Diagnosis: Repaired Laceration  Informed consent was obtained before procedure started.  PROCEDURE: The appropriate timeout was taken. The area was prepped and draped in the usual sterile fashion. Local anesthesia was achieved using 10cc of  Lidocaine 2% with epinephrine. 4-0 absorbable deep suturing completed with 4 stitches followed by a 4-0 absorbable suture with 5 stitches. (3-0 was not readily available and since I already started with 4-0 for superficial closure, I decided to complete it without waiting on 3-0 suture to allow for consistency) Estimated blood loss was less than 0.5 mL. A pressure dressing was applied to the area and anticipatory guidance, as well as standard post-procedure care, was explained. Return precautions are given. The patient tolerated the procedure well without complications. Follow-up visit set for suture removal and evaluation of the laceration in 2 weeks.  Antibiotic coverage since this is a wound dehiscent. I will f/u with her to see how she is doing.

## 2018-06-03 ENCOUNTER — Ambulatory Visit: Payer: Managed Care, Other (non HMO)

## 2018-06-03 ENCOUNTER — Telehealth: Payer: Self-pay | Admitting: *Deleted

## 2018-06-03 NOTE — Telephone Encounter (Signed)
Noticed that patient canceled her 10am appt for suture removal and did not reschedule.  Spoke with Dr. Mingo Amber (preceptor) would like for pt to come in today or tomorrow since they have been in for 2 weeks.  LMOVM for pt to return call. Fleeger, Salome Spotted, CMA

## 2018-06-03 NOTE — Telephone Encounter (Signed)
I called patient to follow-up. However, I was unable to reach her. HIPPA compliant call back message left.  I also contacted her emergency contact number without any luck. We will await her call back. She need wound check and suture removal as soon as possible.

## 2018-06-04 NOTE — Telephone Encounter (Signed)
Attempted to call patient again. No answer, lmovm for callback. , Salome Spotted, CMA

## 2018-06-04 NOTE — Telephone Encounter (Signed)
Called again.  LMOVM for callback. Shanekia Latella, Salome Spotted, CMA

## 2018-06-07 ENCOUNTER — Encounter: Payer: Self-pay | Admitting: Family Medicine

## 2018-06-07 ENCOUNTER — Telehealth: Payer: Self-pay | Admitting: Family Medicine

## 2018-06-07 NOTE — Telephone Encounter (Signed)
I have called her multiple times and left a message without response. Unclear if she got her suture removed else where. I will send her a letter.  Note: I called and followed up with her on the following day after the second suture was placed as well as three days later. On both occasions, she denies any concern.  We will await her callback.

## 2018-06-09 ENCOUNTER — Telehealth: Payer: Self-pay | Admitting: Family Medicine

## 2018-06-09 NOTE — Telephone Encounter (Signed)
  I was able to reach the patient regarding her suture removal.  She is yet to get it removed.  She stated that the area was red and swollen; that was why she canceled her appointment previously. Following that day, her phone stopped working.  I informed her that she should have come in so we can see the wound to make sure it is not infected. She stated that the swelling and redness has now resolved. I made an appointment for her to come in tomorrow for removal. She is unable to come in at the time offered today due to her work schedule.

## 2018-06-10 ENCOUNTER — Ambulatory Visit (INDEPENDENT_AMBULATORY_CARE_PROVIDER_SITE_OTHER): Payer: Managed Care, Other (non HMO) | Admitting: Family Medicine

## 2018-06-10 DIAGNOSIS — Z4802 Encounter for removal of sutures: Secondary | ICD-10-CM | POA: Diagnosis not present

## 2018-06-10 NOTE — Progress Notes (Signed)
Patient ID: [name], female   DOB: [birth year], 63 y.o.   MRN: [id number] Patient refused vital signs. She said she need to get back to work soon.

## 2018-06-10 NOTE — Patient Instructions (Signed)
It was a pleasure to see you today!  To summarize our discussion for this visit:  We removed 5 sutures from your right thigh today. There was no sign of bleeding or infection at that time and the skin was fully healed.   Call the clinic at [phone] if your symptoms worsen or you have any concerns.  Thank you for allowing me to take part in your care,  Dr. Doristine Mango   Thanks for choosing [facility] for your primary care.

## 2018-06-10 NOTE — Progress Notes (Signed)
Subjective:    [name] is a 63 y.o. female who obtained an incision several days ago for a derm procedure which had dehiscence and required re-closure with 5 sutures.  She denies pain, redness, or drainage from the wound.   Review of Systems Pertinent items are noted in HPI.    Objective:    There were no vitals taken for this visit. Injury exam:  A 4 cm laceration noted on the anterior/lateral mid-Right thigh is healing well, without evidence of infection.    Assessment:    Laceration is healing well, without evidence of infection.    Plan:     1. 5 sutures were removed. No bleeding or drainage. Band-aid placed over site to avoid irritation from pants. 2. Wound care discussed. 3. Follow up as needed.

## 2018-06-10 NOTE — Addendum Note (Signed)
Addended by: Andrena Mews T on: 06/10/2018 10:55 AM   Modules accepted: Level of Service

## 2018-06-30 ENCOUNTER — Ambulatory Visit
Admission: RE | Admit: 2018-06-30 | Discharge: 2018-06-30 | Disposition: A | Discharge status: Home / Self Care | Payer: Managed Care, Other (non HMO) | Source: Ambulatory Visit | Attending: Family Medicine | Admitting: Family Medicine

## 2018-06-30 ENCOUNTER — Other Ambulatory Visit: Payer: Self-pay

## 2018-06-30 ENCOUNTER — Encounter: Payer: Self-pay | Admitting: Family Medicine

## 2018-06-30 ENCOUNTER — Ambulatory Visit (INDEPENDENT_AMBULATORY_CARE_PROVIDER_SITE_OTHER): Payer: Managed Care, Other (non HMO) | Admitting: Family Medicine

## 2018-06-30 ENCOUNTER — Other Ambulatory Visit: Payer: Self-pay | Admitting: Family Medicine

## 2018-06-30 VITALS — BP 130/76 | HR 78 | Temp 98.6°F | Wt 161.4 lb

## 2018-06-30 DIAGNOSIS — M79675 Pain in left toe(s): Secondary | ICD-10-CM

## 2018-06-30 DIAGNOSIS — M25551 Pain in right hip: Secondary | ICD-10-CM

## 2018-06-30 NOTE — Patient Instructions (Signed)
It was great seeing you today! We have addressed the following issues today  1. I schedule an X ray for of you foot. 2. You can keep the hard sole shoe for a week or two and try walking in regular shoe and see how the pain is. You can stay longer in the hard sole if still experiencing pain.  If we did any lab work today, and the results require attention, either me or my nurse will get in touch with you. If everything is normal, you will get a letter in mail and a message via . If you don't hear from [place] in two weeks, please give [place] a call. Otherwise, we look forward to seeing you again at your next visit. If you have any questions or concerns before then, please call the clinic at [phone].  Please bring all your medications to every doctors visit  Sign up for My Chart to have easy access to your labs results, and communication with your Primary care physician. Please ask Front Desk for some assistance.   Please check-out at the front desk before leaving the clinic.    Take Care,   Dr. Andy Gauss

## 2018-06-30 NOTE — Progress Notes (Addendum)
Subjective:    Patient ID: [name], female    DOB: 12/16/1955, 63 y.o.   MRN: [id number]   CC: Left great toe pain  HPI: Patient is a 63 year old female who presents today complaining of left great toe pain.  Patient reports that last Thursday she twisted her ankle coming off the Matthews and had her great toe in her left foot he decided to Matthews.  Since then she has had significant swelling and bruising.  She also has been unable to put any weight on that left foot.  She has been using a cane for ambulation.  Patient reports that swelling and bruising has improved since last Thursday but she continued to have pain.  She has tried over-the-counter NSAIDs with minimal relief in her pain.  Patient is here today due to concern for possible fracture.  Smoking status reviewed   ROS: all other systems were reviewed and are negative other than in the HPI   Past Medical History:  Diagnosis Date  . Allergy   . Anemia   . Arthritis   . Blood transfusion without reported diagnosis   . Complication of anesthesia   . Dysrhythmia    PALPITATIONS WITH CAFFEINE/ CHOC  . GERD (gastroesophageal reflux disease)   . Heart murmur   . History of hiatal hernia   . Hyperlipidemia    past hx   . Hypertension   . MIGRAINE, UNSPEC., W/O INTRACTABLE MIGRAINE 06/18/2006   Qualifier: Diagnosis of  By: Darylene Price MD, Elta Guadeloupe    . OSTEOPENIA 06/18/2006   Qualifier: Diagnosis of  By: Darylene Price MD, Elta Guadeloupe    . Osteoporosis    osteopenia  . PONV (postoperative nausea and vomiting)   . RENAL INSUFFICIENCY, CHRONIC 06/18/2006   Qualifier: Diagnosis of  By: Darylene Price MD, Elta Guadeloupe    . Thyroid nodule     Past Surgical History:  Procedure Laterality Date  . APPENDECTOMY    . COLONOSCOPY    . FINGER FRACTURE SURGERY    . LAPAROSCOPIC ENDOMETRIOSIS FULGURATION     LASER TO REMOVE BLOOD  . NOSE SURGERY     BROKEN  . SHOULDER ARTHROSCOPY WITH SUBACROMIAL DECOMPRESSION, ROTATOR CUFF REPAIR AND BICEP TENDON REPAIR Right 11/02/2014    Procedure: RIGHT SHOULDER ARTHROSCOPY WITH SUBACROMIAL DECOMPRESSION, DISTAL CLAVICLE RESECTION, ROTATOR CUFF REPAIR ;  Surgeon: Justice Britain, MD;  Location: [facility];  Service: Orthopedics;  Laterality: Right;    Past medical history, surgical, family, and social history reviewed and updated in the EMR as appropriate.  Objective:  BP 130/76   Pulse 78   Temp 98.6 F (37 C) (Oral)   Wt 161 lb 6 oz (73.2 kg)   SpO2 98%   BMI 26.85 kg/m   Vitals and nursing note reviewed  General: NAD, pleasant, able to participate in exam Cardiac: RRR, normal heart sounds, no murmurs. 2+ radial and PT pulses bilaterally Respiratory: CTAB, normal effort, No wheezes, rales or rhonchi Abdomen: soft, nontender, nondistended, no hepatic or splenomegaly, +BS Extremities: Moderate swelling of lateral aspect of the left great toe.   Unable to put any weight on foot.  Able to dorsiflex and plantar flex great toe with minimal pain.  Tender to palpation around the lateral aspect.  Ankle, midfoot and remaining toes exam was within normal limits. Skin: warm and dry, no rashes noted Neuro: alert and oriented x4, no focal deficits Psych: Normal affect and mood  Left foot hallux U/S: Ultrasound performed by sports medicine fellow Moderate fluid accumulation noted  around joint between first metatarsal and proximal phalanx of the left great toe.  There was also some cortical changes on the proximal aspect of the proximal phalanx concerning for possible avulsion fracture  Assessment & Plan:   Great toe pain, left Patient presented with left great toe pain and swelling for the past 5 days.  Patient has failed conservative treatment at home.  She continued to be unable to put any weight on that left foot.  On exam she still has moderate swelling and is very tender to palpation.  Ultrasound showed cortical changes to the proximal phalanx of the great toe on the proximal aspect concerning for possible avulsion fracture.   Patient will need x-ray of her foot to rule out fracture.  Will place her in a hard sole shoe.  We will follow-up on x-ray results.  She will stay in hard sole shoe for 1 to 2 weeks and will follow-up with PCP or sports medicine as needed for reevaluation.    Marjie Skiff, MD [place] PGY-3

## 2018-06-30 NOTE — Assessment & Plan Note (Addendum)
Patient presented with left great toe pain and swelling for the past 5 days.  Patient has failed conservative treatment at home.  She continued to be unable to put any weight on that left foot.  On exam she still has moderate swelling and is very tender to palpation.  Ultrasound showed cortical changes to the proximal phalanx of the great toe on the proximal aspect concerning for possible avulsion fracture.  Patient will need x-ray of her foot to rule out fracture.  Will place her in a hard sole shoe.  We will follow-up on x-ray results.  She will stay in hard sole shoe for 1 to 2 weeks and will follow-up with PCP or sports medicine as needed for reevaluation.

## 2018-07-21 ENCOUNTER — Ambulatory Visit: Payer: Self-pay | Admitting: Sports Medicine

## 2018-09-10 ENCOUNTER — Telehealth: Payer: Self-pay | Admitting: *Deleted

## 2018-09-10 NOTE — Telephone Encounter (Signed)
Pt calling for results of her hip xrays.  Christen Bame, CMA

## 2018-09-14 NOTE — Telephone Encounter (Signed)
Please let patient know that her xray results were negative for any abnormality. If she is still having problems she will need to schedule another appointment for re-evaluation as she was last seen for this problem in December and the xrays are from march.  Guadalupe Dawn MD PGY-2 Family Medicine Resident

## 2018-09-22 ENCOUNTER — Ambulatory Visit (INDEPENDENT_AMBULATORY_CARE_PROVIDER_SITE_OTHER): Payer: Managed Care, Other (non HMO) | Admitting: Family Medicine

## 2018-09-22 ENCOUNTER — Other Ambulatory Visit: Payer: Self-pay

## 2018-09-22 ENCOUNTER — Encounter: Payer: Self-pay | Admitting: Family Medicine

## 2018-09-22 VITALS — BP 112/62 | HR 90

## 2018-09-22 DIAGNOSIS — M79675 Pain in left toe(s): Secondary | ICD-10-CM | POA: Diagnosis not present

## 2018-09-22 DIAGNOSIS — M25551 Pain in right hip: Secondary | ICD-10-CM

## 2018-09-22 NOTE — Assessment & Plan Note (Signed)
Patient with achy colicky pain in the right hip that radiates from lower back to groin.  Can effectively rule out arthritis given her normal hip x-ray.  Reviewed MR I lumbar spine less than 1 year ago her pain is unlikely because of her back given lack of arthropathy seen.  At this point I think an hip MRI will be beneficial for both diagnosis and treatment.  Pain is likely to be due to a soft tissue structure inside the hip such as a bursa or labral pathology given previous imaging. -MRI right hip -We will discuss his neck steps when MRI results

## 2018-09-22 NOTE — Assessment & Plan Note (Signed)
Patient's pain was secondary to her avulsion fracture of her left great toe.  She states that the pain is improved and that she is hopeful she can come out of her hard soled shoe soon.

## 2018-09-22 NOTE — Patient Instructions (Signed)
It was great seeing again today!  Glad that things been going well since her last visit.  Fortunately I think that we need to get an MRI to better evaluate your hip pain as your x-rays did not reveal anything.  This means the problem is likely due to to the soft tissues in the hip or lower back.  Previous imaging of your lower back did show a bulging disc, so this MRI will be helpful to rule out intra-hip pathology.  I will give you a call back with the results when they come.  Based on the results we can talk about the next steps are.

## 2018-09-22 NOTE — Progress Notes (Addendum)
HPI 63 year old female who presents for right hip pain.  Patient previously seen by me for this problem back in December 2019.  Patient had x-ray performed in March 2020 which showed no normality.  States that the pain still present.  Describes it as a pain that comes and goes.  It starts in her right lower back and wraps around her hip eventually terminating in her right groin.  The pain is characterized as most of the time a dull achy pain, but does become sharp at times.  Patient's hip pain started although back in 2002 after she had "back issues".  Upon review of the lumbar spine MRI she does have some mild disc bulging and tiny protrusion seen at L2-3 and below.  There is no impingement that would explain any hip or leg symptoms.  Patient states that she has tried everything including NSAIDs, steroids, injections to both her back and hip, physical therapy, and "just about everything else".  Patient states that her left foot is healing from her avulsion fracture to her left great toe.  She has been in a hard soled shoe this appointment.  She states that she could hopefully, come out of the shoe soon.  CC: Right hip pain    ROS:   Review of Systems See HPI for ROS.   CC, SH/smoking status, and VS noted  Objective: BP 112/62   Pulse 90   SpO2 63%  Gen: 63 year old African female, resting comfortably, no acute distress HEENT: NCAT, EOMI, PERRL CV: RRR, no murmur Resp: CTAB, no wheezes, non-labored Abd: SNTND, BS present, no guarding or organomegaly Ext: No edema, warm Neuro: Alert and oriented, Speech clear, No gross deficits   Assessment and plan:  Great toe pain, left Patient's pain was secondary to her avulsion fracture of her left great toe.  She states that the pain is improved and that she is hopeful she can come out of her hard soled shoe soon.  Right hip pain Patient with achy colicky pain in the right hip that radiates from lower back to groin.  Can effectively rule  out arthritis given her normal hip x-ray.  Reviewed MR I lumbar spine less than 1 year ago her pain is unlikely because of her back given lack of arthropathy seen.  At this point I think an hip MRI will be beneficial for both diagnosis and treatment.  Pain is likely to be due to a soft tissue structure inside the hip such as a bursa or labral pathology given previous imaging. -MRI right hip -We will discuss his neck steps when MRI results   Orders Placed This Encounter  Procedures  . MR HIP RIGHT WO CONTRAST    Ht: 5'5 / wt: 161 / yes claus / no metal removed from eyes by dr / no bullet wounds, shrapnel / no sx on right hip, brain heart eyes ears / no implanted devices / no needs / [employer] order/ miriam w pt  09/22/2018 no to COVID-19 questions ; patient aware to come alone & to wear mask/ miriam 2:34pm     Standing Status:   Future    Standing Expiration Date:   11/22/2019    Order Specific Question:   What is the patient's sedation requirement?    Answer:   No Sedation    Order Specific Question:   Does the patient have a pacemaker or implanted devices?    Answer:   No    Order Specific Question:   Preferred  imaging location?    Answer:   [facility] (table limit-550lbs)    Order Specific Question:   Radiology Contrast Protocol - do NOT remove file path    Answer:   [email or web address]    No orders of the defined types were placed in this encounter.    Guadalupe Dawn MD PGY-2 Family Medicine Resident  09/22/2018 4:09 PM

## 2018-10-06 ENCOUNTER — Other Ambulatory Visit: Payer: Self-pay | Admitting: Family Medicine

## 2018-10-09 ENCOUNTER — Other Ambulatory Visit: Payer: Managed Care, Other (non HMO)

## 2018-12-06 ENCOUNTER — Other Ambulatory Visit: Payer: Self-pay | Admitting: Family Medicine

## 2018-12-17 ENCOUNTER — Ambulatory Visit (INDEPENDENT_AMBULATORY_CARE_PROVIDER_SITE_OTHER): Payer: Managed Care, Other (non HMO) | Admitting: Family Medicine

## 2018-12-17 ENCOUNTER — Other Ambulatory Visit: Payer: Self-pay

## 2018-12-17 VITALS — BP 120/75 | HR 75 | Temp 98.0°F | Wt 155.0 lb

## 2018-12-17 DIAGNOSIS — Z0000 Encounter for general adult medical examination without abnormal findings: Secondary | ICD-10-CM | POA: Diagnosis not present

## 2018-12-17 DIAGNOSIS — Z131 Encounter for screening for diabetes mellitus: Secondary | ICD-10-CM

## 2018-12-17 DIAGNOSIS — Z23 Encounter for immunization: Secondary | ICD-10-CM

## 2018-12-17 DIAGNOSIS — N183 Chronic kidney disease, stage 3 (moderate): Secondary | ICD-10-CM

## 2018-12-17 DIAGNOSIS — M25551 Pain in right hip: Secondary | ICD-10-CM

## 2018-12-17 DIAGNOSIS — N1832 Chronic kidney disease, stage 3b: Secondary | ICD-10-CM

## 2018-12-17 DIAGNOSIS — E7800 Pure hypercholesterolemia, unspecified: Secondary | ICD-10-CM | POA: Diagnosis not present

## 2018-12-17 LAB — POCT GLYCOSYLATED HEMOGLOBIN (HGB A1C): Hemoglobin A1C: 5.7 % — AB (ref 4.0–5.6)

## 2018-12-17 NOTE — Patient Instructions (Signed)
It was great seeing you again today!  Sounds like you are about to get up-to-date on all of your health maintenance items.  When you get your Pap smear and mammogram done please let me know or see me the records.  If you change your mind about the flu shot please come back and see [place] or get it done through your job.  We will check a lipid panel and your hemoglobin A1c today.  I will contact the insurance company again about getting your MRI done.

## 2018-12-18 LAB — LIPID PANEL
Chol/HDL Ratio: 4 ratio (ref 0.0–4.4)
Cholesterol, Total: 208 mg/dL — ABNORMAL HIGH (ref 100–199)
HDL: 52 mg/dL (ref >39)
LDL Calculated: 126 mg/dL — ABNORMAL HIGH (ref 0–99)
Triglycerides: 148 mg/dL (ref 0–149)
VLDL Cholesterol Cal: 30 mg/dL (ref 5–40)

## 2018-12-20 ENCOUNTER — Encounter: Payer: Self-pay | Admitting: Family Medicine

## 2018-12-20 NOTE — Assessment & Plan Note (Signed)
Still unclear etiology of her hip pain.  Has limitation on adduction and abduction.  Will need MRI to better characterize.  Insurance denied in the past.  Will attempt to have this approved.

## 2018-12-20 NOTE — Assessment & Plan Note (Signed)
Received flu vaccine.  Will get mammogram and Pap smear done at gynecologist next month.  Will screen for diabetes and cholesterol.

## 2018-12-20 NOTE — Assessment & Plan Note (Signed)
Sees by [facility].  States that she had a BMP very recently.  Will attempt to get these records.  States that her creatinine function was stable.

## 2018-12-20 NOTE — Progress Notes (Addendum)
   HPI 63 year old African-American female who presents for physical.  Patient states that she is doing very well and her only issue remains her chronic right hip pain.  Her hip pain is not changed much in quality or severity since last visit.   Patient states that she is getting around 30 minutes of exercise every other day and is "eating pretty well".  She is overdue for a mammogram and Pap smear which she states will be done next month at her gynecologist.  She also would like to have a flu shot at this appointment.  CC: Annual physical   ROS:   Review of Systems See HPI for ROS.   CC, SH/smoking status, and VS noted  Objective: BP 120/75   Pulse 75   Temp 98 F (36.7 C) (Axillary)   Wt 155 lb (70.3 kg)   SpO2 97%   BMI 25.79 kg/m  Gen: 63 year old African-American female, no acute distress, resting comfortably HEENT: NCAT, EOMI, PERRL CV: RRR, no murmur Resp: CTAB, no wheezes, non-labored Abd: SNTND, BS present, no guarding or organomegaly Neuro: Alert and oriented, Speech clear, No gross deficits Right hip: Inspection: No gross asymmetry between left and right hip Palpation: No tenderness to palpation on lateral hip. Range of motion: Markedly limited range of motion on both hip Adduction and abduction. Strength: Hip extension and flexion 5-5 strength.  4-5 strength hip Adduction and abduction Neurovascular: Warm and dry.  Sensation intact all distributions.  Assessment and plan:  Annual physical exam Received flu vaccine.  Will get mammogram and Pap smear done at gynecologist next month.  Will screen for diabetes and cholesterol.  Healthcare maintenance Received flu vaccine.  Will get mammogram and Pap smear done at gynecologist next month.  Will screen for diabetes and cholesterol.  CKD stage G3b/A3, GFR 30-44 and albumin creatinine ratio >300 mg/g (HCC) Sees by [facility].  States that she had a BMP very recently.  Will attempt to get these records.   States that her creatinine function was stable.  Right hip pain Still unclear etiology of her hip pain.  Has limitation on adduction and abduction.  Will need MRI to better characterize.  Insurance denied in the past.  Will attempt to have this approved.   Orders Placed This Encounter  Procedures  . Flu Vaccine QUAD 36+ mos IM  . Lipid panel    Order Specific Question:   Has the patient fasted?    Answer:   No  . POCT glycosylated hemoglobin (Hb A1C)    Associate with Z13.1    No orders of the defined types were placed in this encounter.    Guadalupe Dawn MD PGY-3 Family Medicine Resident  12/21/2018 4:23 PM

## 2018-12-21 DIAGNOSIS — Z0000 Encounter for general adult medical examination without abnormal findings: Secondary | ICD-10-CM | POA: Insufficient documentation

## 2018-12-21 HISTORY — DX: Encounter for general adult medical examination without abnormal findings: Z00.00

## 2018-12-21 NOTE — Assessment & Plan Note (Signed)
Received flu vaccine.  Will get mammogram and Pap smear done at gynecologist next month.  Will screen for diabetes and cholesterol.

## 2018-12-21 NOTE — Addendum Note (Signed)
Addended by: Pauletta Browns on: 12/21/2018 04:24 PM   Modules accepted: Level of Service

## 2019-02-11 ENCOUNTER — Encounter: Payer: Self-pay | Admitting: Family Medicine

## 2019-02-11 ENCOUNTER — Other Ambulatory Visit: Payer: Self-pay

## 2019-02-11 ENCOUNTER — Ambulatory Visit (INDEPENDENT_AMBULATORY_CARE_PROVIDER_SITE_OTHER): Payer: Managed Care, Other (non HMO) | Admitting: Family Medicine

## 2019-02-11 VITALS — BP 122/74 | HR 87 | Wt 152.2 lb

## 2019-02-11 DIAGNOSIS — M542 Cervicalgia: Secondary | ICD-10-CM

## 2019-02-11 DIAGNOSIS — M79645 Pain in left finger(s): Secondary | ICD-10-CM

## 2019-02-11 DIAGNOSIS — M25551 Pain in right hip: Secondary | ICD-10-CM

## 2019-02-11 DIAGNOSIS — M79644 Pain in right finger(s): Secondary | ICD-10-CM

## 2019-02-11 DIAGNOSIS — E042 Nontoxic multinodular goiter: Secondary | ICD-10-CM

## 2019-02-11 DIAGNOSIS — Z0000 Encounter for general adult medical examination without abnormal findings: Secondary | ICD-10-CM

## 2019-02-11 NOTE — Patient Instructions (Addendum)
It was great seeing you again today!  I am sorry about your right hip pain.  I think you have some aspect of hip impingement syndrome although this does not typically last for such a long time.  I did give you some hip abductor strengthening exercises which should help some.  You can take topical liniments, and Tylenol for the pain.  The best way to get a better sense of your pain is to do an MRI.  I believe insurance should cover it at this time since has been treated for so long by me.  I am a little bit concerned about your neck pain given your history as well as thyroid nodules.  I think getting a neck ultrasound for better surveillance is a good idea at this point.  Based on that we can discuss going back to see the ENT for further management.  Your finger pain sounds consistent with osteoarthritis although it is rare to get this in both hands at the same time.  An x-ray of each hand will give a better sense of was going on.

## 2019-02-14 ENCOUNTER — Ambulatory Visit
Admission: RE | Admit: 2019-02-14 | Discharge: 2019-02-14 | Disposition: A | Discharge status: Home / Self Care | Payer: Managed Care, Other (non HMO) | Source: Ambulatory Visit | Attending: Family Medicine | Admitting: Family Medicine

## 2019-02-14 DIAGNOSIS — E042 Nontoxic multinodular goiter: Secondary | ICD-10-CM

## 2019-02-15 DIAGNOSIS — M79646 Pain in unspecified finger(s): Secondary | ICD-10-CM | POA: Insufficient documentation

## 2019-02-15 NOTE — Progress Notes (Signed)
HPI 63 year old female who presents with right hip pain.  This is a chronic issue which has been going on for "several years".  The pain is a dull aching pain which occurs the medial hip, close to the inguinal canal.  The patient had a right hip x-rays on 06/2018 which showed no abnormality.  She had an MR lumbar spine on 12/2017 which showed a very mild disc bulging tiny noncompressive protrusions at L2/L3, but nothing to explain her leg findings.  Patient states that the pain can be quite severe when she is trying to walk.  Patient also reports that she has had a very mild pain intermittently in her neck off and on for the last couple of years.  She has known thyroid nodules which have been biopsied in the past which were not malignant.  She is interested in getting a repeat ultrasound to better investigate this.  She has not had this imaged since 2014.  Lastly the patient states that she is having trouble straightening out both of her index fingers.  She states that this is been going on for about a week or so.  She can passively completely extend her fingers but has trouble doing it actively.  It is not painful.  She does not have any known nodules.  CC: Right hip pain   ROS:   Review of Systems See HPI for ROS.   CC, SH/smoking status, and VS noted  Objective: BP 122/74   Pulse 87   Wt 152 lb 3.2 oz (69 kg)   SpO2 96%   BMI 25.33 kg/m  Gen: 63 year old African-American female, no acute distress, resting comfortably HEENT: Unable to palpate any notable thyroid nodules.  No cervical lymphadenopathy.  Mild tenderness over right strap muscle. CV: Regular rate and rhythm, no M/R/G Resp: Lungs clear to auscultation bilaterally, no accessory muscle use Neuro: Alert and oriented, Speech clear, No gross deficits Right hip: No focal asymmetry on inspection.  Tenderness to palpation, medial hip and within the inguinal canal.  Pain elicited with hip extension and both internal and external  rotation.  5 out of 5 strength in the right hip abductors, but markedly weaker than left side.  Limited range of motion to both internal and external rotation.  Mild gait abnormality secondary to pain. Bilateral hand: No focal tenderness on palpation of bilateral index finger.  Unable to fully extend with active extension, can passively extend to fully to extension.   Assessment and plan:  No problem-specific Assessment & Plan notes found for this encounter.   Orders Placed This Encounter  Procedures  . DG Hand 2 View Left    Standing Status:   Future    Standing Expiration Date:   04/12/2020    Order Specific Question:   Reason for Exam (SYMPTOM  OR DIAGNOSIS REQUIRED)    Answer:   index finger pain, OA suspected    Order Specific Question:   Preferred imaging location?    Answer:   [facility]    Order Specific Question:   Radiology Contrast Protocol - do NOT remove file path    Answer:   \\[email or web address]\Radiant\DXFluoroContrastProtocols.pdf  . DG Hand 2 View Right    Standing Status:   Future    Standing Expiration Date:   04/12/2020    Order Specific Question:   Reason for Exam (SYMPTOM  OR DIAGNOSIS REQUIRED)    Answer:   index finger pain, OA suspected    Order Specific Question:   Preferred imaging  location?    Answer:   [facility]    Order Specific Question:   Radiology Contrast Protocol - do NOT remove file path    Answer:   \\[email or web address]\[email or web address]\Radiant\DXFluoroContrastProtocols.pdf  . MR HIP RIGHT WO CONTRAST    Ht: 5'5 / wt: 152 / yes claus / no metal removed from eyes by dr / no bullet wounds, shrapnel / no sx on riht hip, brain heart eyes ears / no implanted devices / no glucose monitor / no needs / Emergency planning/management officer w pt 02/11/2019 no to COVID-19 questions/ miriam 9:16am    Standing Status:   Future    Standing Expiration Date:   04/12/2020    Order Specific Question:   ** REASON FOR EXAM (FREE TEXT)    Answer:   chronic right hip pain     Order Specific Question:   What is the patient's sedation requirement?    Answer:   No Sedation    Order Specific Question:   Does the patient have a pacemaker or implanted devices?    Answer:   No    Order Specific Question:   Preferred imaging location?    Answer:   [facility] (table limit-550lbs)    Order Specific Question:   Radiology Contrast Protocol - do NOT remove file path    Answer:   \\[email or web address]\[email or web address]\Radiant\mriPROTOCOL.PDF  . US THYROID    152 / no needs / Furniture conservator/restorer order/ miriam w pt 02/11/2019 no to COVID-19 questions/ miriam 9:16am Sent for [facility] Status:   Future    Number of Occurrences:   1    Standing Expiration Date:   04/12/2020    Order Specific Question:   Reason for Exam (SYMPTOM  OR DIAGNOSIS REQUIRED)    Answer:   h/o paratid gland swelling, thyroid nodules    Order Specific Question:   Preferred imaging location?    Answer:   [facility]    No orders of the defined types were placed in this encounter.    Guadalupe Dawn MD PGY-3 Family Medicine Resident  02/15/2019 10:45 AM

## 2019-02-15 NOTE — Assessment & Plan Note (Signed)
Patient without bony abnormality consistent with arthritis as demonstrated by x-ray.  Her pathology is likely soft tissue within the hip given her normal lumbar spine MRI.  Likely has a combination of factors including bursitis, possible iliopsoas muscle strain, and even hip labral tear.  Given the prolonged duration of this pathology (several years) to see labral tear is highest on the differential.  We will get MR right hip.  Previously requested this imaging modality but was denied by insurance but given the prolonged time course, should be approved at this time.  Gave hip abductor strengthening exercises. -MRI right hip -Hip abductor strengthening exercises given

## 2019-02-15 NOTE — Assessment & Plan Note (Signed)
Lateral index finger pain.  Has difficulty with extension, but can get full extension with passive motion.  This started around the same time.  It would be unlikely for osteoarthritis present in this way.  We will get x-rays to better evaluate joint space.  Pending those results could consider doing a rheumatologic eval.

## 2019-02-15 NOTE — Assessment & Plan Note (Signed)
None thyroid cysts.  Will get head neck ultrasound to better evaluate make sure that her cyst/nodules have not taken on a more ominous appearance.  Given the location of her pain it seems more likely that her strap muscle has been strained.

## 2019-02-15 NOTE — Assessment & Plan Note (Signed)
Only overdue for mammogram and Pap smear.  Has appointment with her gynecologist next month to get these arranged.

## 2019-03-02 ENCOUNTER — Other Ambulatory Visit: Payer: Self-pay | Admitting: Nephrology

## 2019-03-02 DIAGNOSIS — M545 Low back pain, unspecified: Secondary | ICD-10-CM

## 2019-03-02 DIAGNOSIS — N1832 Chronic kidney disease, stage 3b: Secondary | ICD-10-CM

## 2019-03-03 ENCOUNTER — Other Ambulatory Visit: Payer: Managed Care, Other (non HMO)

## 2019-03-09 ENCOUNTER — Telehealth: Payer: Self-pay | Admitting: Family Medicine

## 2019-03-09 NOTE — Telephone Encounter (Signed)
Pt called and needs a referral

## 2019-03-11 ENCOUNTER — Other Ambulatory Visit: Payer: Managed Care, Other (non HMO)

## 2019-03-16 ENCOUNTER — Telehealth: Payer: Self-pay | Admitting: Family Medicine

## 2019-03-16 NOTE — Telephone Encounter (Signed)
I have not received notifications from [place] or notices in [place]. Dr. Kris Mouton- please let me know if I can can assist.   Dorris Singh, MD  [facility] Medicine Teaching Service

## 2019-03-16 NOTE — Telephone Encounter (Signed)
Pt was referred to [facility] to have a MRI done. She has [employer] and they require a prior authorization approval for this procedure. [employer] wanted the doctor to do a peer to peer,. All paperwork from [facility] was placed in Dr. Fara Boros box. Pt is now calling stating that Christella Scheuermann called her and said the Dr. Owens Shark canceled the MRI. She is not sure why we canceled the order.I spoke to Dr. Owens Shark who did not have amy idea what this was about. Dr. Kris Mouton and Dr. Owens Shark will investigate this and follow up with the patient or me. jw

## 2019-03-28 ENCOUNTER — Ambulatory Visit
Admission: RE | Admit: 2019-03-28 | Discharge: 2019-03-28 | Disposition: A | Discharge status: Home / Self Care | Payer: Managed Care, Other (non HMO) | Source: Ambulatory Visit | Attending: Nephrology | Admitting: Nephrology

## 2019-03-28 ENCOUNTER — Other Ambulatory Visit: Payer: Self-pay

## 2019-03-28 DIAGNOSIS — M545 Low back pain, unspecified: Secondary | ICD-10-CM

## 2019-03-28 DIAGNOSIS — N1832 Chronic kidney disease, stage 3b: Secondary | ICD-10-CM

## 2019-07-26 ENCOUNTER — Other Ambulatory Visit: Payer: Self-pay | Admitting: Nephrology

## 2019-07-26 DIAGNOSIS — N028 Recurrent and persistent hematuria with other morphologic changes: Secondary | ICD-10-CM

## 2019-07-28 ENCOUNTER — Ambulatory Visit
Admission: RE | Admit: 2019-07-28 | Discharge: 2019-07-28 | Disposition: A | Discharge status: Home / Self Care | Payer: 59 | Source: Ambulatory Visit | Attending: Nephrology | Admitting: Nephrology

## 2019-07-28 DIAGNOSIS — N028 Recurrent and persistent hematuria with other morphologic changes: Secondary | ICD-10-CM

## 2019-07-29 ENCOUNTER — Ambulatory Visit (INDEPENDENT_AMBULATORY_CARE_PROVIDER_SITE_OTHER): Payer: 59 | Admitting: Family Medicine

## 2019-07-29 ENCOUNTER — Other Ambulatory Visit: Payer: Self-pay

## 2019-07-29 ENCOUNTER — Ambulatory Visit
Admission: RE | Admit: 2019-07-29 | Discharge: 2019-07-29 | Disposition: A | Discharge status: Home / Self Care | Payer: 59 | Source: Ambulatory Visit | Attending: Family Medicine | Admitting: Family Medicine

## 2019-07-29 VITALS — BP 110/56 | HR 80 | Wt 158.0 lb

## 2019-07-29 DIAGNOSIS — M25572 Pain in left ankle and joints of left foot: Secondary | ICD-10-CM

## 2019-07-29 NOTE — Assessment & Plan Note (Signed)
Given her lack of point tenderness over the malleoli, fifth metatarsal, and navicular, the mechanism of injury, and her improvement over the past several days, her symptoms are most consistent with a lateral ankle sprain. However, in the setting of her post-menopausal status and prior fractures XR could be helpful in ruling out fracture. Plan:  Xray to rule out fracture.  Encouraged compression, rest, ice, elevation, and NSAIDs for pain control.

## 2019-07-29 NOTE — Progress Notes (Addendum)
  Subjective:  Patient ID: [name]  DOB: 16-Dec-1955 MRN: [id number]  [name] is a 64 y.o. female here today for evaluation of an acute ankle injury.   HPI:  Ankle Pain/Swelling: - [name] fell from a step ladder 8 days ago while changing a light bulb at home and landed on an inverted, plantar flexed L foot. She does not report hearing any sounds from the ankle, but was in 10/10 pain and was unable to bear weight for several hours immediately after the fall. She treated the injury with ice, rest, and Tylenol with modest improvement in her symptoms. Today her pain is an 8/10 and she is able to bear weight, though she is most comfortable on the ball of her foot. She reports no symptoms in her R leg. She has a history of great toe fracture of the L foot in March of 2020. She is post-menopausal.    Objective:  BP (!) 110/56   Pulse 80   Wt 158 lb (71.7 kg)   SpO2 99%   BMI 26.29 kg/m   Vitals and nursing note reviewed  General: NAD, pleasant MSK: L ankle with edema and ecchymoses surrounding lateral malleolus. No tenderness of the lateral or medial malleoli, navicular, or base of the 5th metatarsal. Negative calcaneal squeeze test.  FROM, symmetrical to the right side. Strength with plantar flexion symmetrical to R side. Strength with dorsiflexion diminished compared to R.   Assessment & Plan:   Ankle pain, left Given her lack of point tenderness over the malleoli, fifth metatarsal, and navicular, the mechanism of injury, and her improvement over the past several days, her symptoms are most consistent with a lateral ankle sprain. However, in the setting of her post-menopausal status and prior fractures XR could be helpful in ruling out fracture. Plan:  Xray to rule out fracture.  Encouraged compression, rest, ice, elevation, and NSAIDs for pain control.    [facility] Student   I personally saw and evaluated the patient, performing the key elements of  the service. I agree with the findings and management plan that is described in the medical student's note with my edits above.   Rory Percy, DO PGY-3, [facility] 07/29/2019 7:16 PM

## 2019-07-29 NOTE — Patient Instructions (Signed)
It was great to see you!  Our plans for today:  - Continue taking ibuprofen for pain. - Wear an ASO brace (see below) or ACE bandage for compression when you are moving around on it. - Ice the area for 15 minutes a few times per day. - Elevate the foot when you are sitting.  - Go to [facility] for your xray. You do not need an appointment for this.   Take care and seek immediate care sooner if you develop any concerns.   Dr. [facility]

## 2019-09-20 ENCOUNTER — Other Ambulatory Visit: Payer: Self-pay

## 2019-09-20 NOTE — Telephone Encounter (Signed)
Opened in error.   Makar Slatter C Jacy Howat, RN  

## 2019-09-21 ENCOUNTER — Other Ambulatory Visit: Payer: Self-pay

## 2019-09-21 ENCOUNTER — Encounter: Payer: Self-pay | Admitting: Family Medicine

## 2019-09-21 ENCOUNTER — Ambulatory Visit (INDEPENDENT_AMBULATORY_CARE_PROVIDER_SITE_OTHER): Payer: 59 | Admitting: Family Medicine

## 2019-09-21 VITALS — BP 114/72 | HR 78 | Ht 66.0 in | Wt 157.6 lb

## 2019-09-21 DIAGNOSIS — M25551 Pain in right hip: Secondary | ICD-10-CM | POA: Diagnosis not present

## 2019-09-21 DIAGNOSIS — N1832 Chronic kidney disease, stage 3b: Secondary | ICD-10-CM | POA: Diagnosis not present

## 2019-09-21 DIAGNOSIS — I10 Essential (primary) hypertension: Secondary | ICD-10-CM

## 2019-09-21 DIAGNOSIS — R7303 Prediabetes: Secondary | ICD-10-CM

## 2019-09-21 LAB — POCT GLYCOSYLATED HEMOGLOBIN (HGB A1C): HbA1c, POC (controlled diabetic range): 5.7 % (ref 0.0–7.0)

## 2019-09-21 MED ORDER — AMLODIPINE BESYLATE 2.5 MG PO TABS: 2.5000 mg | ORAL_TABLET | Freq: Every day | ORAL | 0 refills | Status: DC

## 2019-09-21 NOTE — Patient Instructions (Signed)
It was great seeing you again today!  We get you scheduled for an MRI to better evaluate your right hip pain.  I think going to amlodipine 2.5 mg is a good middle ground to k tension.  eep you from going too low, but also to treat your hypertension.  You are up-to-date on all your health maintenance items.

## 2019-09-22 NOTE — Assessment & Plan Note (Signed)
Managed by nephrology.  Recent blood work was stable.  History of IgA nephropathy.

## 2019-09-22 NOTE — Progress Notes (Signed)
   CHIEF COMPLAINT / HPI: 64 year old female who presents for blood pressure checkup.  Patient previously well controlled on amlodipine.  Her nephrologist switch her to losartan for renal protective properties a few months ago.  She states that she had really bad side effects from that medication and states that she would like to be put back on the amlodipine.  She has been having some high blood pressures at home in the 294T systolic.  No symptoms of headaches, lightheadedness.  On top of her blood pressure the patient has had continued Right hip pain. This is a chronic issue which has been going on for "several years".  The pain is a dull aching pain which occurs the medial hip, close to the inguinal canal.  The patient had a right hip x-rays on 06/2018 which showed no abnormality.  She had an MR lumbar spine on 12/2017 which showed a very mild disc bulging tiny noncompressive protrusions at L2/L3, but nothing to explain her leg findings.  Patient has had multiple right hip MRIs get denied by her insurance.  She states that she recently switched insurance companies recently.  She sees nephrology for CKD stage III.  Recently had blood work done by nephrology and states that everything looked good and was "stable".  PERTINENT  PMH / PSH:   OBJECTIVE: BP 114/72   Pulse 78   Ht 5\' 6"  (1.676 m)   Wt 157 lb 9.6 oz (71.5 kg)   SpO2 97%   BMI 25.44 kg/m   Gen: 64 year old African-American female, no acute distress, resting comfortably Respiratory: No acute distress, no accessory muscle use Right hip: No focal asymmetry on inspection.  Tenderness to palpation, medial hip and within the inguinal canal.  Pain elicited with hip extension and both internal and external rotation.  5 out of 5 strength in the right hip abductors, but markedly weaker than left side.  Limited range of motion to both internal and external rotation.  Mild gait abnormality secondary to pain. Neuro: Alert and oriented, Speech clear,  No gross deficits   ASSESSMENT / PLAN:  Essential hypertension BP 114/72 in clinic.  Given the patient's high blood pressures at home we will start amlodipine 2.5 mg daily.  Recommended recheck in a few weeks to further adjust if needed.  CKD stage G3b/A3, GFR 30-44 and albumin creatinine ratio >300 mg/g ([place]) Managed by nephrology.  Recent blood work was stable.  History of IgA nephropathy.  Right hip pain Problem essentially unchanged from previous examinations and previous visits.  Has changed insurance, hopefully will get MRI right hip approved.  Labral tear is highest on differential. -MRI right hip     Guadalupe Dawn, MD [place]

## 2019-09-22 NOTE — Assessment & Plan Note (Addendum)
Problem essentially unchanged from previous examinations and previous visits.  Has changed insurance, hopefully will get MRI right hip approved.  Labral tear is highest on differential. -MRI right hip

## 2019-09-22 NOTE — Assessment & Plan Note (Signed)
BP 114/72 in clinic.  Given the patient's high blood pressures at home we will start amlodipine 2.5 mg daily.  Recommended recheck in a few weeks to further adjust if needed.

## 2019-09-27 ENCOUNTER — Telehealth: Payer: Self-pay | Admitting: Family Medicine

## 2019-09-27 NOTE — Telephone Encounter (Signed)
Patient is dropping of Placard renewal form to be filled out by Dr. Kris Mouton. Patients LDOS 09/21/19.  I have placed the form in the red team folder.

## 2019-09-27 NOTE — Telephone Encounter (Signed)
Placed in MDs box to be filled out. Jessie Schrieber, CMA  

## 2019-09-28 NOTE — Telephone Encounter (Signed)
Completed and placed in nurse's box  Guadalupe Dawn MD PGY-3 Family Medicine Resident

## 2019-09-28 NOTE — Telephone Encounter (Signed)
Patient called and informed that form is ready to be picked up. Copy placed in batch scanning. Original placed at front desk.   Talbot Grumbling, RN

## 2019-10-06 ENCOUNTER — Telehealth: Payer: Self-pay | Admitting: Family Medicine

## 2019-10-08 ENCOUNTER — Ambulatory Visit (HOSPITAL_COMMUNITY): Payer: 59

## 2019-12-01 IMAGING — US US THYROID
1 series · 13 of 25 positions shown · non-contrast
Comparison: 04/15/2012

CLINICAL DATA: 63-year-old female with parotid gland swelling and
thyroid nodules

EXAM:
THYROID ULTRASOUND
TECHNIQUE: Ultrasound examination of the thyroid gland and adjacent soft
tissues was performed.

[Series 1: us thyroid · 0.08mm/px · 13 of 73 slices shown]
[im 1/73]
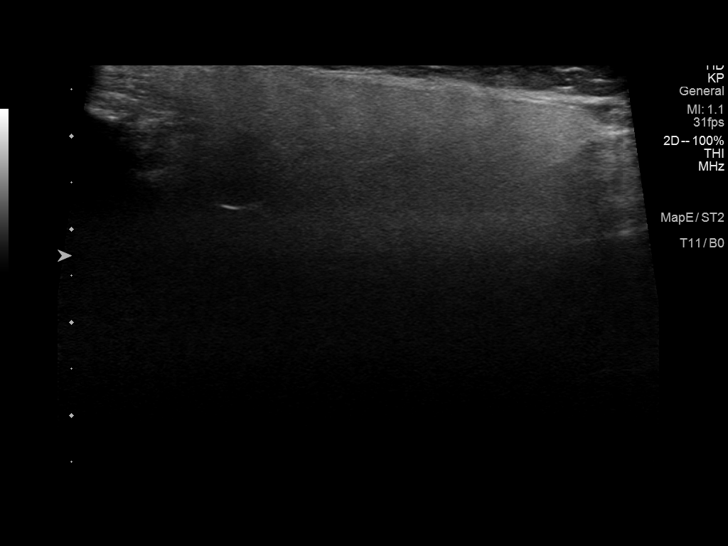
[im 7/73]
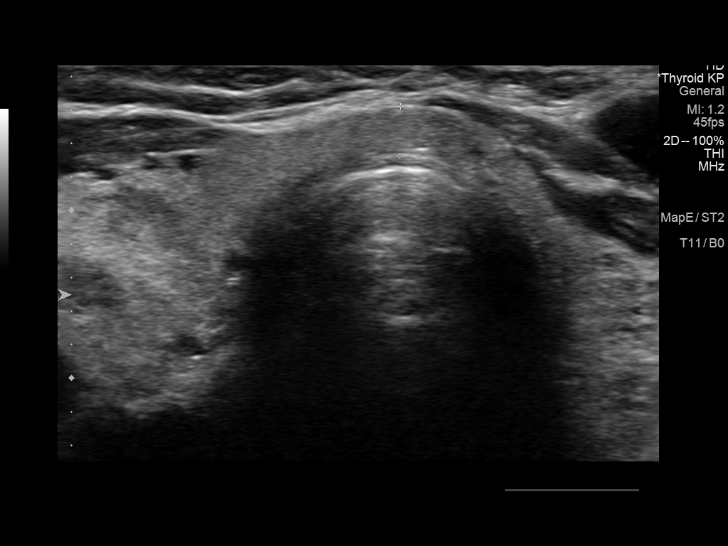
[im 13/73]
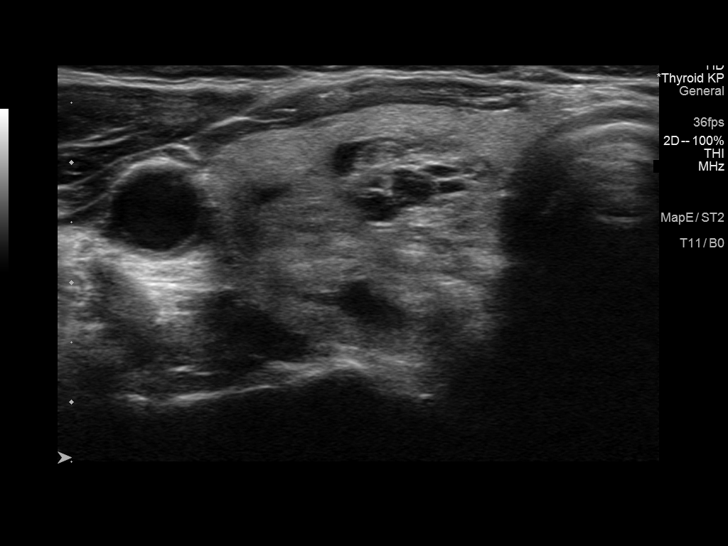
[im 19/73]
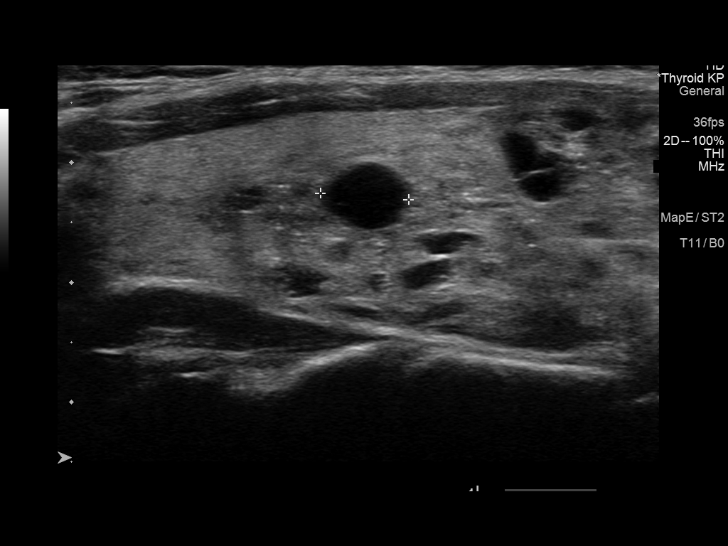
[im 25/73]
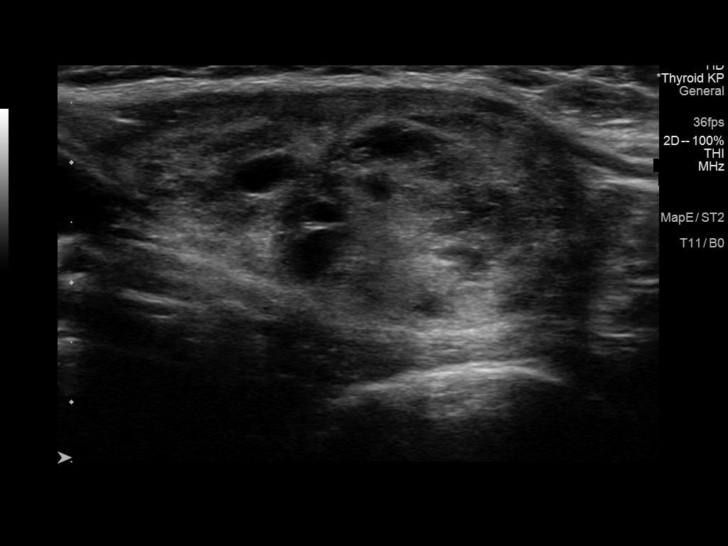
[im 31/73]
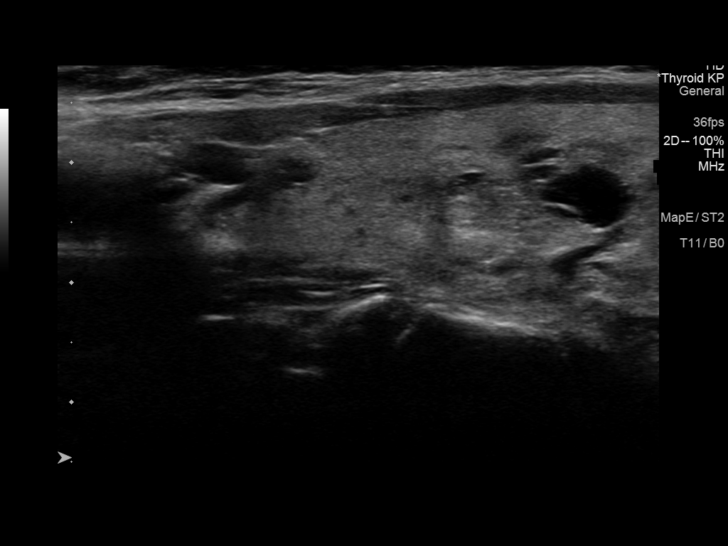
[im 37/73]
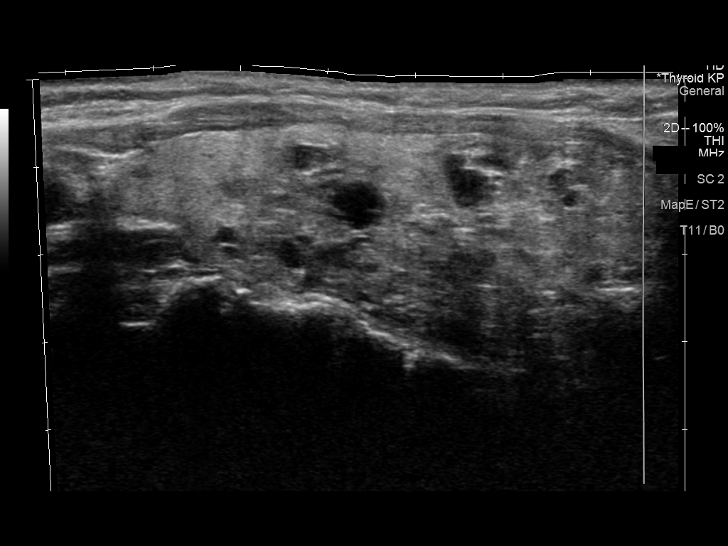
[im 43/73]
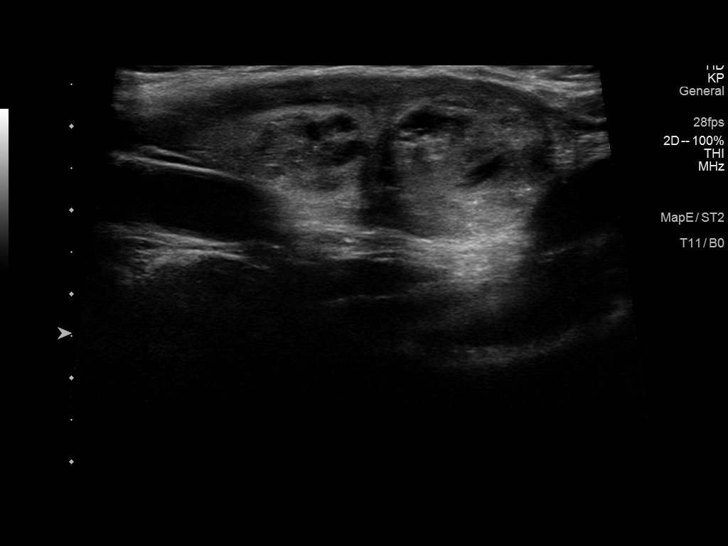
[im 49/73]
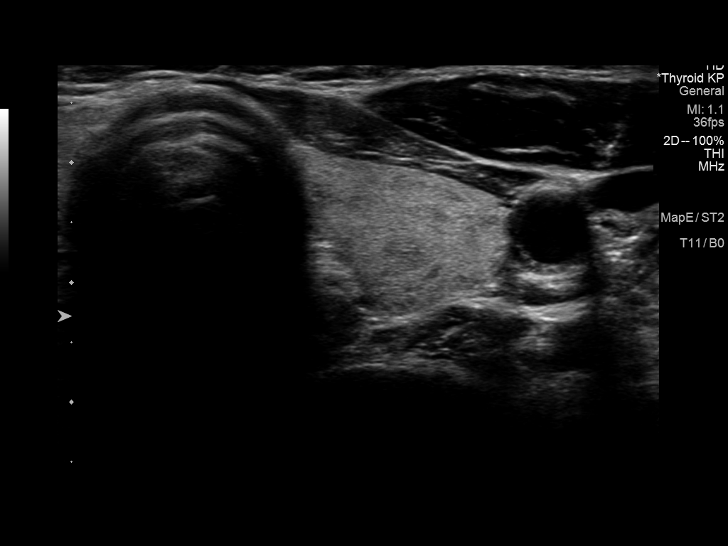
[im 55/73]
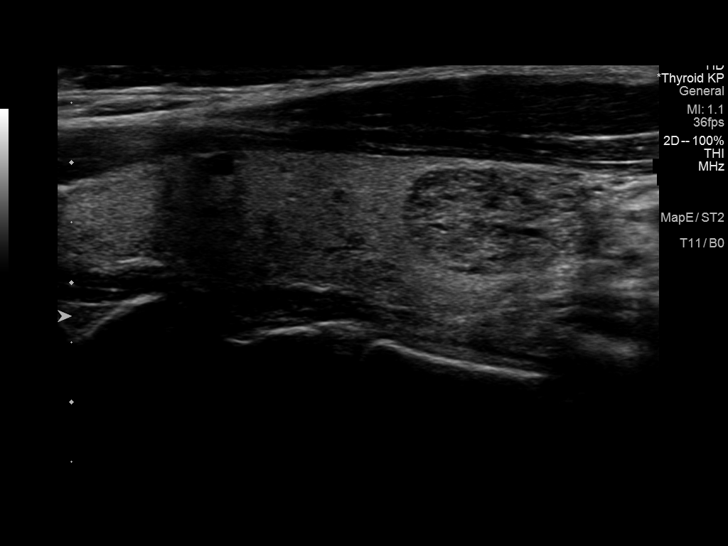
[im 61/73]
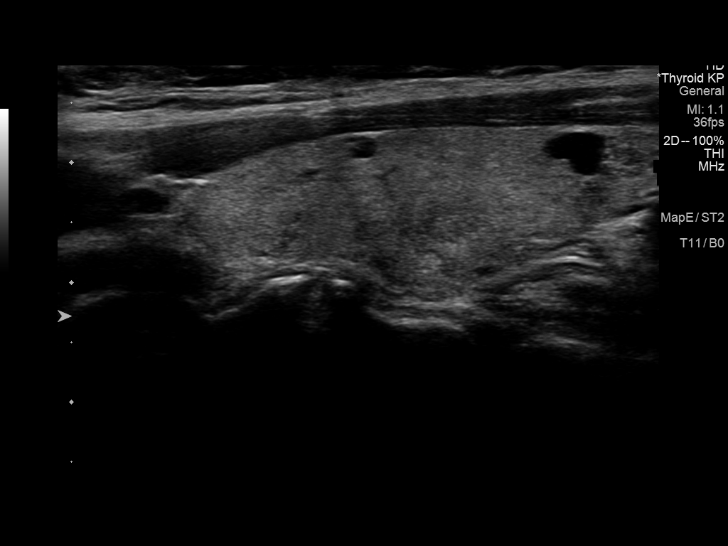
[im 67/73]
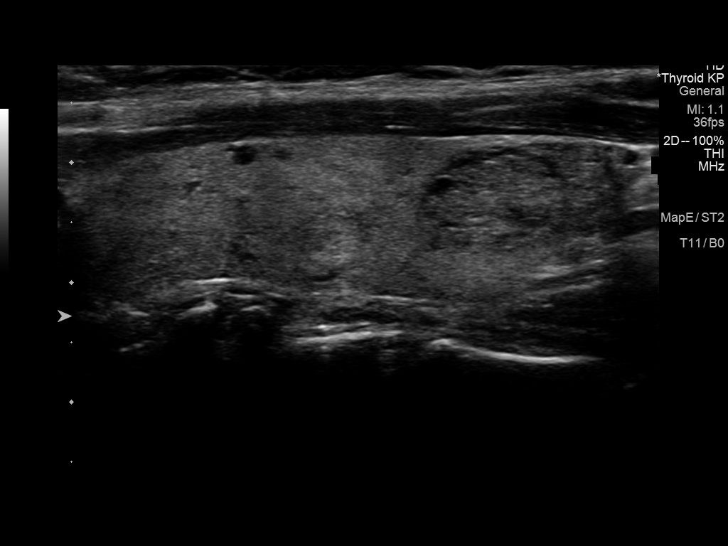
[im 73/73]
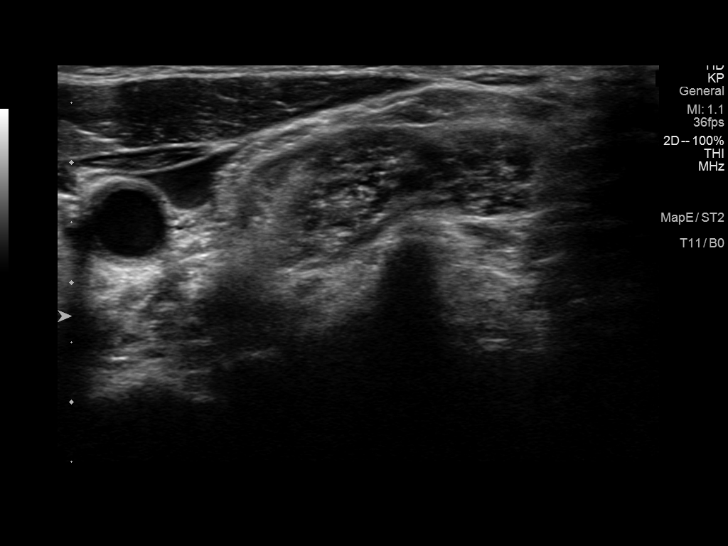

[13 of 25 positions shown; findings below may reference images not displayed]

FINDINGS: Parenchymal Echotexture: Moderately heterogenous

Isthmus: 0.3 cm

Right lobe: 6.6 cm x 2.6 cm x 2.6 cm

Left lobe: 4.5 cm x 1.4 cm x 1.7 cm

_________________________________________________________

Estimated total number of nodules >/= 1 cm: 2

Number of spongiform nodules >/=  2 cm not described below (TR1): 0

Number of mixed cystic and solid nodules >/= 1.5 cm not described
below (TR2): 0

_________________________________________________________

Nodule # 1:

Location: Right; Mid

Maximum size: 0.7 cm; Other 2 dimensions: 0.6 cm x 0.6 cm

Composition: cystic/almost completely cystic (0)

Echogenicity: anechoic (0)

Shape: not taller-than-wide (0)

Margins: smooth (0)

Echogenic foci: none (0)

ACR TI-RADS total points: 0.

ACR TI-RADS risk category: TR1 (0-1 points).

ACR TI-RADS recommendations:

Cystic nodule does not meet criteria for surveillance biopsy

_________________________________________________________

Nodule # 2:

Location: Right; Inferior

Maximum size: 2.4 cm; Other 2 dimensions: 2.2 cm x 1.8 cm

Composition: mixed cystic and solid (1)

Echogenicity: isoechoic (1)

Shape: not taller-than-wide (0)

Margins: ill-defined (0)

Echogenic foci: none (0)

ACR TI-RADS total points: 2.

ACR TI-RADS risk category: TR2 (2 points).

ACR TI-RADS recommendations:

Nodule does not meet criteria for surveillance or biopsy

_________________________________________________________

Nodule # 3:

Location: Left; Inferior

Maximum size: 1.5 cm; Other 2 dimensions: 1.3 cm x 0.9 cm

Composition: spongiform (0)

ACR TI-RADS recommendations:

Spongiform nodule does not meet criteria for surveillance or biopsy

_________________________________________________________

No adenopathy
IMPRESSION: Multinodular thyroid again demonstrated, relatively unchanged from
the prior ultrasound survey.

No thyroid nodule meets criteria for biopsy or surveillance, as
designated by the newly established ACR TI-RADS criteria.

Recommendations follow those established by the new ACR TI-RADS
criteria ([HOSPITAL] 9187;[DATE]).

## 2019-12-19 ENCOUNTER — Other Ambulatory Visit: Payer: Self-pay | Admitting: Family Medicine

## 2019-12-19 NOTE — Telephone Encounter (Signed)
LVM for patient to call and schedule an appointment for refills.  .Lorris Carducci R Aina Rossbach, CMA  

## 2019-12-30 ENCOUNTER — Other Ambulatory Visit: Payer: Self-pay

## 2019-12-30 MED ORDER — PANTOPRAZOLE SODIUM 20 MG PO TBEC: 20.0000 mg | DELAYED_RELEASE_TABLET | Freq: Every day | ORAL | 1 refills | Status: DC

## 2020-01-13 ENCOUNTER — Other Ambulatory Visit: Payer: Self-pay

## 2020-01-13 ENCOUNTER — Ambulatory Visit: Payer: 59 | Admitting: Student in an Organized Health Care Education/Training Program

## 2020-01-27 ENCOUNTER — Encounter: Payer: Self-pay | Admitting: Student in an Organized Health Care Education/Training Program

## 2020-01-27 ENCOUNTER — Ambulatory Visit (INDEPENDENT_AMBULATORY_CARE_PROVIDER_SITE_OTHER): Payer: 59 | Admitting: Student in an Organized Health Care Education/Training Program

## 2020-01-27 ENCOUNTER — Other Ambulatory Visit: Payer: Self-pay

## 2020-01-27 VITALS — BP 161/80 | HR 87 | Ht 65.0 in | Wt 156.0 lb

## 2020-01-27 DIAGNOSIS — N1832 Chronic kidney disease, stage 3b: Secondary | ICD-10-CM

## 2020-01-27 DIAGNOSIS — Z23 Encounter for immunization: Secondary | ICD-10-CM

## 2020-01-27 DIAGNOSIS — I10 Essential (primary) hypertension: Secondary | ICD-10-CM

## 2020-01-27 MED ORDER — LOSARTAN POTASSIUM 25 MG PO TABS: 25.0000 mg | ORAL_TABLET | Freq: Every day | ORAL | 0 refills | Status: DC

## 2020-01-27 NOTE — Patient Instructions (Signed)
It was a pleasure to see you today!  To summarize our discussion for this visit:  Your blood pressure is elevated. We will start a new medication today to help control your blood pressure and help protect your kidneys. Please come back in 2 weeks to check your kidney function and your blood pressure. Remember to drink plenty of water.   Thank you for getting your flu shot today.  Some additional health maintenance measures we should update are: Health Maintenance Due  Topic Date Due  . INFLUENZA VACCINE  11/20/2019  .    Call the clinic at [phone] if your symptoms worsen or you have any concerns.   Thank you for allowing me to take part in your care,  Dr. Doristine Mango

## 2020-01-27 NOTE — Assessment & Plan Note (Signed)
Discussed with patient the benefits for an ARB over amlodipine with her kidney function.  She was agreeable to trialing losartan.  She does not think that she has ever tried this before. -Blood pressure rechecked by provider today was 150/90 -Prescribe losartan 25 mg daily -BMP and nurse visit for blood pressure recheck in 2 weeks. -Follow-up with PCP in 1 month

## 2020-01-27 NOTE — Progress Notes (Signed)
    SUBJECTIVE:   CHIEF COMPLAINT / HPI: BP med refill  HTN-previously on amlodipine but was switched to lisinopril by nephrologist for kidney protective factors.  Patient discontinued lisinopril because she was thought it was causing her fatigue and abnormal feelings in her extremities.  She thinks she was switched back to amlodipine but is not sure.  She has been without any blood pressure medication for about a month.  She denies any negative side effects of her blood pressure being elevated.  She has also had episodes of hypotension in the past.  CKD-follows with nephrology regularly and has had stable kidney function with recent labs.  OBJECTIVE:   BP (!) 161/80   Pulse 87   Ht 5\' 5"  (1.651 m)   Wt 156 lb (70.8 kg)   SpO2 100%   BMI 25.96 kg/m   General: NAD, pleasant, able to participate in exam Extremities: no edema or cyanosis. WWP. Skin: warm and dry, no rashes noted Neuro: alert and oriented, no focal deficits Psych: Normal affect and mood  ASSESSMENT/PLAN:   Essential hypertension Discussed with patient the benefits for an ARB over amlodipine with her kidney function.  She was agreeable to trialing losartan.  She does not think that she has ever tried this before. -Blood pressure rechecked by provider today was 150/90 -Prescribe losartan 25 mg daily -BMP and nurse visit for blood pressure recheck in 2 weeks. -Follow-up with PCP in 1 month   Flu vaccine administered today.  Patient tolerated well.  [place]

## 2020-01-31 ENCOUNTER — Telehealth: Payer: Self-pay

## 2020-01-31 NOTE — Telephone Encounter (Signed)
Patient calls nurse line requesting a different rx for blood pressure. Patient reports that kidney specialist took her off of losartan in the past and she is concerned about restarting medication.   To PCP  Please advise  Talbot Grumbling, RN

## 2020-02-01 NOTE — Telephone Encounter (Signed)
Patient calls nurse line checking the status of blood pressure meds. After reviewing PCP note I advised patient she was on Lisinopril before and that is why PCP changed her to Losartan. Patient states she was never on Lisinopril and Lisinopril is the medication PCP told her she was prescribing, but instead received Losartan. Patient has "old" bottles of Losartan at home. Patient is requesting Lisinopril. Please advise.

## 2020-02-10 ENCOUNTER — Other Ambulatory Visit: Payer: 59

## 2020-03-26 NOTE — Telephone Encounter (Signed)
Patient calls nurse line upset that PCP has not reached out to her about blood pressure concerns from back in October. Patient reports since she never heard from anyone she stopped taking BP meds all together, as she was confused and PCP never reached out. I advised patient an apt would be best to check BP, however patient declined.

## 2020-03-29 ENCOUNTER — Telehealth: Payer: Self-pay | Admitting: Student in an Organized Health Care Education/Training Program

## 2020-03-29 NOTE — Telephone Encounter (Signed)
Patient walked in to let Dr. Ouida Sills know that she sent in Losartan to her pharmacy after the last appointment when they discussed she would prefer to take the Amlodipine. She would like to have this refilled as soon as possible. Patient states she has left messaged to discuss with pcp but has not had her call returned.  Please advise.

## 2020-03-30 ENCOUNTER — Other Ambulatory Visit: Payer: Self-pay | Admitting: Student in an Organized Health Care Education/Training Program

## 2020-03-30 NOTE — Progress Notes (Signed)
Called patient to discuss her BP medications. Advised and prescribed losartan at last visit for BP and kidney benefit. Has not been taking any blood pressure medication since our last visit.  Monitoring BP at home and the ranges have been from 120/70-140/90. She feels that this is very high. She is asymptomatic.  She insists that Losartan side effects could not be tolerate. She instead would like to take amlodipine. Spent time educating patient on BP and that her range was likely normal. Offered to do ambulatory BP monitoring to get a more accurate picture of her BP variability and she declined.  Spent time educating patient on benefits of ARB treatment for kidney function and that we could try another medication in this class. She declined.  She decided she will go to her kidney doctor for amlodipine.

## 2020-05-16 ENCOUNTER — Other Ambulatory Visit: Payer: 59

## 2020-06-13 ENCOUNTER — Other Ambulatory Visit: Payer: Self-pay | Admitting: Student in an Organized Health Care Education/Training Program

## 2020-08-09 ENCOUNTER — Other Ambulatory Visit: Payer: Self-pay

## 2020-08-09 ENCOUNTER — Encounter: Payer: Self-pay | Admitting: Family Medicine

## 2020-08-09 ENCOUNTER — Ambulatory Visit
Admission: RE | Admit: 2020-08-09 | Discharge: 2020-08-09 | Disposition: A | Discharge status: Home / Self Care | Payer: 59 | Source: Ambulatory Visit | Attending: Family Medicine | Admitting: Family Medicine

## 2020-08-09 ENCOUNTER — Ambulatory Visit (INDEPENDENT_AMBULATORY_CARE_PROVIDER_SITE_OTHER): Payer: 59 | Admitting: Family Medicine

## 2020-08-09 VITALS — BP 106/62 | HR 97 | Ht 65.0 in | Wt 152.6 lb

## 2020-08-09 DIAGNOSIS — M25471 Effusion, right ankle: Secondary | ICD-10-CM

## 2020-08-09 DIAGNOSIS — R2689 Other abnormalities of gait and mobility: Secondary | ICD-10-CM

## 2020-08-09 DIAGNOSIS — R2241 Localized swelling, mass and lump, right lower limb: Secondary | ICD-10-CM

## 2020-08-09 NOTE — Progress Notes (Signed)
    SUBJECTIVE:   CHIEF COMPLAINT / HPI:   Right ankle pain and swelling - while walking to car yesterday felt weird sharp popping sensation at back of right ankle - no injury when this happened, did not roll ankle - did not hurt at that time - this morning woke up to marked swelling of the lateral right ankle - now unable to bear weight on heel due to lateral malleolar pain, must walk on ball of foot - hx osteopenia, not on meds  PERTINENT  PMH / PSH: osteopenia, HTN, GERD, CKD3, HLD  OBJECTIVE:   BP 106/62   Pulse 97   Ht 5\' 5"  (1.651 m)   Wt 152 lb 9.6 oz (69.2 kg)   SpO2 98%   BMI 25.39 kg/m    PHQ-9:  Depression screen [facility] 2/9 08/09/2020 09/21/2019 07/29/2019  Decreased Interest 0 0 0  Down, Depressed, Hopeless 0 0 0  PHQ - 2 Score 0 0 0  Altered sleeping 0 - -  Tired, decreased energy 0 - -  Change in appetite 0 - -  Feeling bad or failure about yourself  0 - -  Trouble concentrating 0 - -  Moving slowly or fidgety/restless 0 - -  Suicidal thoughts 0 - -  PHQ-9 Score 0 - -     GAD-7: No flowsheet data found.   Physical Exam General: Awake, alert, oriented Extremities: swelling over right lateral ankle, malleolar TTP over bony prominence and posteriorly, Achilles tendon palpable and intact, no ecchymoses, non-tender over distal tibia/fibia, palpable pulses, skin warm and dry   ASSESSMENT/PLAN:   Localized swelling of right foot Per [place] [facility] rules, cannot rule out, recommend films. Hx osteopenia on no medication for this. Ultrasound of right lateral ankle without gross abnormality.  - XR complete R ankle and foot, will call with results - Laced ankle brace - F/u one week     Ezequiel Essex, MD [place]

## 2020-08-09 NOTE — Patient Instructions (Signed)
It was wonderful to see you today. Thank you for allowing me to be a part of your care. Below is a short summary of what we discussed at your visit today:  Swollen right ankle -Right foot and ankle x-rays at [place] [facility] -Use anti-inflammatories like ibuprofen and Tylenol alternating, make sure to follow instructions on the bottle - Elevate leg, apply ice pack - Follow-up in a week or 2   Please bring all of your medications to every appointment!  If you have any questions or concerns, please do not hesitate to contact us via phone or MyChart message.   Ezequiel Essex, MD

## 2020-08-09 NOTE — Assessment & Plan Note (Signed)
Per [place] [facility] rules, cannot rule out, recommend films. Hx osteopenia on no medication for this. Ultrasound of right lateral ankle without gross abnormality.  - XR complete R ankle and foot, will call with results - Laced ankle brace - F/u one week

## 2020-08-12 ENCOUNTER — Telehealth: Payer: Self-pay | Admitting: Family Medicine

## 2020-08-12 NOTE — Telephone Encounter (Signed)
Spoke with patient regarding foot and ankle XR negative. Patient reports further swelling. Now her big toe is red swollen and hot. She is concerned for gout. Does not have personal history of gout. Made appointment for Wed 4/27 at 4:15pm with Dr. Sonia Side for follow up and eval of possible gout.   Ezequiel Essex, MD

## 2020-08-15 ENCOUNTER — Encounter: Payer: Self-pay | Admitting: Family Medicine

## 2020-08-15 ENCOUNTER — Other Ambulatory Visit: Payer: Self-pay

## 2020-08-15 ENCOUNTER — Ambulatory Visit (INDEPENDENT_AMBULATORY_CARE_PROVIDER_SITE_OTHER): Payer: 59 | Admitting: Family Medicine

## 2020-08-15 VITALS — BP 140/78 | HR 74 | Ht 65.0 in | Wt 153.0 lb

## 2020-08-15 DIAGNOSIS — R2241 Localized swelling, mass and lump, right lower limb: Secondary | ICD-10-CM

## 2020-08-15 DIAGNOSIS — R002 Palpitations: Secondary | ICD-10-CM | POA: Diagnosis not present

## 2020-08-15 DIAGNOSIS — M79674 Pain in right toe(s): Secondary | ICD-10-CM

## 2020-08-15 DIAGNOSIS — M7989 Other specified soft tissue disorders: Secondary | ICD-10-CM

## 2020-08-15 NOTE — Progress Notes (Signed)
Patient had to leave before lab or EKG.  She will return tomorrow for labs and EKG.  ED precautions given in the meantime. Christen Bame, CMA

## 2020-08-15 NOTE — Progress Notes (Signed)
Error. See previous note

## 2020-08-15 NOTE — Patient Instructions (Signed)
It was great seeing you today!  Today you came in for right big toe pain with concern for gout. I am prescribing prednisone 40mg  (2 tablets) to take for 5 days.   I will call you with any abnormal results   Please check-out at the front desk before leaving the clinic. Please schedule to see your PCP for your annual exam but if you need to be seen earlier than that for any new issues we're happy to fit you in, just give [place] a call!    Regarding lab work today:  Due to recent changes in healthcare laws, you may see the results of your imaging and laboratory studies on MyChart before your provider has had a chance to review them.  I understand that in some cases there may be results that are confusing or concerning to you. Not all laboratory results come back in the same time frame and you may be waiting for multiple results in order to interpret others.  Please give [place] 72 hours in order for your provider to thoroughly review all the results before contacting the office for clarification of your results. If everything is normal, you will get a letter in the mail or a message in My Chart. Please give [place] a call if you do not hear from [place] after 2 weeks.  Please bring all of your medications with you to each visit.    If you haven't already, sign up for My Chart to have easy access to your labs results, and communication with your primary care physician.  Feel free to call with any questions or concerns at any time, at [phone].   Take care,  Dr. Shary Key [facility]

## 2020-08-16 ENCOUNTER — Other Ambulatory Visit: Payer: Self-pay

## 2020-08-16 ENCOUNTER — Ambulatory Visit: Payer: 59

## 2020-08-16 ENCOUNTER — Other Ambulatory Visit: Payer: 59

## 2020-08-16 ENCOUNTER — Telehealth: Payer: Self-pay

## 2020-08-16 ENCOUNTER — Ambulatory Visit (HOSPITAL_COMMUNITY)
Admission: RE | Admit: 2020-08-16 | Discharge: 2020-08-16 | Disposition: A | Discharge status: Home / Self Care | Payer: 59 | Source: Ambulatory Visit | Attending: Family Medicine | Admitting: Family Medicine

## 2020-08-16 DIAGNOSIS — R002 Palpitations: Secondary | ICD-10-CM | POA: Insufficient documentation

## 2020-08-16 DIAGNOSIS — K219 Gastro-esophageal reflux disease without esophagitis: Secondary | ICD-10-CM | POA: Insufficient documentation

## 2020-08-16 DIAGNOSIS — N183 Chronic kidney disease, stage 3 unspecified: Secondary | ICD-10-CM | POA: Insufficient documentation

## 2020-08-16 DIAGNOSIS — I129 Hypertensive chronic kidney disease with stage 1 through stage 4 chronic kidney disease, or unspecified chronic kidney disease: Secondary | ICD-10-CM | POA: Insufficient documentation

## 2020-08-16 DIAGNOSIS — M79674 Pain in right toe(s): Secondary | ICD-10-CM

## 2020-08-16 MED ORDER — PREDNISONE 20 MG PO TABS: 40.0000 mg | ORAL_TABLET | Freq: Every day | ORAL | 0 refills | Status: DC

## 2020-08-16 NOTE — Progress Notes (Signed)
Patient presents to nurse clinic for EKG. Patient was seen for an OV yesterday for palpitations, however had to leave before EKG was performed.  Patient reports she feels better today than she did yesterday.  Patient denies SOB, dizziness, headache, or NV. EKG performed and given to PCP Dr. Arby Barrette for interpretation.  EKG WNL. Patient advised to monitor symptoms over the next few days and if she is not feeling better by the end of the weekend to schedule an apt on Monday.  ED precautions given to patient.

## 2020-08-16 NOTE — Telephone Encounter (Signed)
Patient calls nurse line regarding rx for gout. Patient was seen in clinic yesterday and was told medication was going to be sent to pharmacy. Per chart review, office note indicates that prednisone 40 mg was prescribed for five days. However, I am unable to find medication in med list.   Please advise.   Talbot Grumbling, RN

## 2020-08-17 LAB — BASIC METABOLIC PANEL
BUN/Creatinine Ratio: 13 (ref 12–28)
BUN: 26 mg/dL (ref 8–27)
CO2: 24 mmol/L (ref 20–29)
Calcium: 9.9 mg/dL (ref 8.7–10.3)
Chloride: 98 mmol/L (ref 96–106)
Creatinine, Ser: 2.03 mg/dL — ABNORMAL HIGH (ref 0.57–1.00)
Glucose: 100 mg/dL — ABNORMAL HIGH (ref 65–99)
Potassium: 4.3 mmol/L (ref 3.5–5.2)
Sodium: 138 mmol/L (ref 134–144)
eGFR: 27 mL/min/{1.73_m2} — ABNORMAL LOW (ref >59)

## 2020-08-17 LAB — TSH: TSH: 1.13 u[IU]/mL (ref 0.450–4.500)

## 2020-08-17 LAB — URIC ACID: Uric Acid: 8.4 mg/dL — ABNORMAL HIGH (ref 3.0–7.2)

## 2020-08-18 NOTE — Progress Notes (Signed)
    SUBJECTIVE:   CHIEF COMPLAINT / HPI:   [name] is a 65 yo who complains of foot swelling with c/f gout   Presented to the clinic on 4/21 with R ankle pain and swelling that started on 4/20 while walking. Pt felt sharp popping sensation on back of ankle.XR negative.  Patient presents for severe pain that is now localized to her right 1st toe which started on Sunday, 4/24 without any inciting injury. Never had this pain before. Tried takingTylenol and Oxycodone for pain without relief. Denies alcohol use, does not eat much red meat, does drink sodas. Also states that the pain makes her heart palpitate. Reports previous palpatiions and had a stress test in the past. Occasionally has chest pain in the center of her chest when she lays on the left side at night. Denies current palpitations, CP, SOB.  Patient had to leave clinic early prior to obtaining labs and EKG but stated she would return the following day to complete workup.   PERTINENT  PMH / PSH:  HTN, GERD, CKD stage 3 OBJECTIVE:   BP 140/78   Pulse 74   Ht 5\' 5"  (1.651 m)   Wt 153 lb (69.4 kg)   SpO2 99%   BMI 25.46 kg/m    General: alert, pleasant, NAD CV: RRR no murmurs Resp: CTAB normal WOB MSK: chest non tender to palpation  Extremities: 1st toe with significant erythema and edema that is very tender to light palpation  ASSESSMENT/PLAN:   No problem-specific Assessment & Plan notes found for this encounter.   R toe pain and swelling No inciting injury. Likely due to gout given isolated R 1st toe erythema, edema and exquisite tenderness.  - prednisone 40mg  daily - checking labs to assess kidney function - Tylenol for pain - Can consider colchicine  - f/u if symptoms persist after steroid course   Palpations Likely in the setting of severe pain. EKG NSR.  - return precautions given  - can consider cardiology consult if symptoms persist    [place]

## 2020-09-20 ENCOUNTER — Ambulatory Visit (INDEPENDENT_AMBULATORY_CARE_PROVIDER_SITE_OTHER): Payer: 59 | Admitting: Student in an Organized Health Care Education/Training Program

## 2020-09-20 ENCOUNTER — Other Ambulatory Visit: Payer: Self-pay

## 2020-09-20 VITALS — BP 110/62 | HR 80 | Wt 149.4 lb

## 2020-09-20 DIAGNOSIS — M7989 Other specified soft tissue disorders: Secondary | ICD-10-CM

## 2020-09-20 DIAGNOSIS — J040 Acute laryngitis: Secondary | ICD-10-CM | POA: Diagnosis not present

## 2020-09-20 DIAGNOSIS — M1A0710 Idiopathic chronic gout, right ankle and foot, without tophus (tophi): Secondary | ICD-10-CM | POA: Diagnosis not present

## 2020-09-20 DIAGNOSIS — N179 Acute kidney failure, unspecified: Secondary | ICD-10-CM

## 2020-09-20 DIAGNOSIS — M79674 Pain in right toe(s): Secondary | ICD-10-CM | POA: Diagnosis not present

## 2020-09-20 DIAGNOSIS — M109 Gout, unspecified: Secondary | ICD-10-CM | POA: Insufficient documentation

## 2020-09-20 NOTE — Progress Notes (Signed)
   SUBJECTIVE:   CHIEF COMPLAINT / HPI: gout and voice loss  Voice change- <1week of voice loss associated with increased green nasal mucus production with cough. Her voice has gradually been improving since onset but the increased mucus production continues. Denies any fevers and feels otherwise well.   Gout- seen and treated for acute gout flare 4/27 with steroids. took steroid pack. Thinks it got better but then began having some worsening swelling and pain this weekend in same joint so started wearing an ankle brace. Located in R 1st MTP. Has not tried other treatments at home. She is still able to ambulate.   OBJECTIVE:   BP 110/62   Pulse 80   Wt 149 lb 6.4 oz (67.8 kg)   SpO2 95%   BMI 24.86 kg/m   Physical Exam Vitals and nursing note reviewed.  Constitutional:      General: She is not in acute distress.    Appearance: She is not ill-appearing or toxic-appearing.  HENT:     Right Ear: Tympanic membrane and external ear normal.     Left Ear: Tympanic membrane and external ear normal.     Mouth/Throat:     Mouth: Mucous membranes are moist.     Pharynx: Oropharynx is clear. No oropharyngeal exudate or posterior oropharyngeal erythema.  Cardiovascular:     Pulses:          Dorsalis pedis pulses are 2+ on the right side.       Posterior tibial pulses are 2+ on the right side.  Pulmonary:     Effort: Pulmonary effort is normal.  Musculoskeletal:     Cervical back: No rigidity or tenderness.  Feet:     Right foot:     Skin integrity: Skin breakdown (pealing of superficial layer of skin surrounding 1st MTP) present. No ulcer, blister, erythema, warmth, callus, dry skin or fissure.     Toenail Condition: Right toenails are normal.     Comments: Tenderness to palpation of 1st MTP. ROM intact. Neg tophi.  Lymphadenopathy:     Cervical: No cervical adenopathy.    ASSESSMENT/PLAN:   Gout R MTP. Recently treated with steroid pack which improved pain/swelling but has ~5 day  return of pain and swelling. No swelling or redness appreciated on exam but recurrent attacks common in weeks following a gout flare.  - checking BMP and uric acid today due to kidney function possibly limiting treatment options - referral to podiatry, per patient preference - recommended topical voltaren gel in short term - consider adding on allopurinol and/or acute treatment pending labs   Laryngitis Likely related to viral symptoms. Resolving spontaneously.  Will continue to monitor for now for complete resolution.  Patient informed to contact us if not resolving completely in a couple weeks. Would consider ENT referral for visualization of vocal cords.      [place]

## 2020-09-20 NOTE — Assessment & Plan Note (Signed)
R MTP. Recently treated with steroid pack which improved pain/swelling but has ~5 day return of pain and swelling. No swelling or redness appreciated on exam but recurrent attacks common in weeks following a gout flare.  - checking BMP and uric acid today due to kidney function possibly limiting treatment options - referral to podiatry, per patient preference - recommended topical voltaren gel in short term - consider adding on allopurinol and/or acute treatment pending labs

## 2020-09-20 NOTE — Assessment & Plan Note (Signed)
Likely related to viral symptoms. Resolving spontaneously.  Will continue to monitor for now for complete resolution.  Patient informed to contact us if not resolving completely in a couple weeks. Would consider ENT referral for visualization of vocal cords.

## 2020-09-20 NOTE — Patient Instructions (Signed)
It was a pleasure to see you today!  To summarize our discussion for this visit:  For your foot- please use voltaren gel which you can get over the counter at your pharmacy. I can send in a medication for pain as well after we get your lab results to make sure your kidneys are doing well. I sent in a referral to the foot doctor today.  For your voice- I believe this will improve in the next couple weeks, but if not, please let me know and we can send in a referral to ENT.  Some additional health maintenance measures we should update are: Health Maintenance Due  Topic Date Due  . URINE MICROALBUMIN  Never done  . Zoster Vaccines- Shingrix (1 of 2) Never done  . COVID-19 Vaccine (3 - Booster for Pfizer series) 02/08/2020  .    Please return to our clinic to see me as needed.  Call the clinic at [phone] if your symptoms worsen or you have any concerns.   Thank you for allowing me to take part in your care,  Dr. Doristine Mango

## 2020-09-21 LAB — BASIC METABOLIC PANEL
BUN/Creatinine Ratio: 12 (ref 12–28)
BUN: 31 mg/dL — ABNORMAL HIGH (ref 8–27)
CO2: 16 mmol/L — ABNORMAL LOW (ref 20–29)
Calcium: 10.2 mg/dL (ref 8.7–10.3)
Chloride: 104 mmol/L (ref 96–106)
Creatinine, Ser: 2.57 mg/dL — ABNORMAL HIGH (ref 0.57–1.00)
Glucose: 84 mg/dL (ref 65–99)
Potassium: 5.1 mmol/L (ref 3.5–5.2)
Sodium: 143 mmol/L (ref 134–144)
eGFR: 20 mL/min/{1.73_m2} — ABNORMAL LOW (ref >59)

## 2020-09-21 LAB — URIC ACID: Uric Acid: 9.4 mg/dL — ABNORMAL HIGH (ref 3.0–7.2)

## 2020-09-24 ENCOUNTER — Other Ambulatory Visit: Payer: Self-pay | Admitting: Student in an Organized Health Care Education/Training Program

## 2020-09-24 MED ORDER — PREDNISONE 10 MG (21) PO TBPK: ORAL_TABLET | ORAL | 0 refills | Status: DC

## 2020-09-24 NOTE — Progress Notes (Signed)
Prednisone pack for recurrent gout. Repeat CMP showed Cr worsening to 2.57.  Follow up with podiatry for further treatment.  Recheck BMP at follow up with PCP.

## 2020-09-27 ENCOUNTER — Ambulatory Visit (INDEPENDENT_AMBULATORY_CARE_PROVIDER_SITE_OTHER): Payer: 59 | Admitting: Podiatry

## 2020-09-27 ENCOUNTER — Other Ambulatory Visit: Payer: Self-pay

## 2020-09-27 DIAGNOSIS — M1000 Idiopathic gout, unspecified site: Secondary | ICD-10-CM

## 2020-09-27 DIAGNOSIS — M7751 Other enthesopathy of right foot: Secondary | ICD-10-CM | POA: Diagnosis not present

## 2020-09-27 MED ORDER — TRIAMCINOLONE ACETONIDE 10 MG/ML IJ SUSP
10.0000 mg | Freq: Once | INTRAMUSCULAR | Status: AC
  Administered 2020-09-27: 10 mg

## 2020-09-27 NOTE — Patient Instructions (Addendum)
While at your visit today you received a steroid injection in your foot or ankle to help with your pain. Along with having the steroid medication there is some "numbing" medication in the shot that you received. Due to this you may notice some numbness to the area for the next couple of hours.    The actually benefit from the steroid injection may take up to 2-7 days to see a difference. You may actually experience a small (as in 10%) INCREASE in pain in the first 24 hours---that is common. It would be best if you can ice the area today and take anti-inflammatory medications (such as Ibuprofen, Motrin, or Aleve) if you are able to take these medications. If you were prescribed another medication to help with the pain go ahead and start that medication today    Things to watch out for that you should contact us or a health care provider urgently would include: 1. Unusual (as in more than 10%) increase in pain 2. New fever > 101.5 3. New swelling or redness of the injected area.  4. Streaking of red lines around the area injected.  If you have any questions or concerns about this, please give our office a call at [phone].     Gout  Gout is painful swelling of your joints. Gout is a type of arthritis. It is caused by having too much uric acid in your body. Uric acid is a chemical that is made when your body breaks down substances called purines. If your body has too much uric acid, sharp crystals can form and build up in your joints. This causes pain and swelling. Gout attacks can happen quickly and be very painful (acute gout). Over time, the attacks can affect more joints and happen more often (chronic gout). What are the causes? Too much uric acid in your blood. This can happen because: Your kidneys do not remove enough uric acid from your blood. Your body makes too much uric acid. You eat too many foods that are high in purines. These foods include organ meats, some seafood, and  beer. Trauma or stress. What increases the risk? Having a family history of gout. Being female and middle-aged. Being female and having gone through menopause. Being very overweight (obese). Drinking alcohol, especially beer. Not having enough water in the body (being dehydrated). Losing weight too quickly. Having an organ transplant. Having lead poisoning. Taking certain medicines. Having kidney disease. Having a skin condition called psoriasis. What are the signs or symptoms? An attack of acute gout usually happens in just one joint. The most common place is the big toe. Attacks often start at night. Other joints that may be affected include joints of the feet, ankle, knee, fingers, wrist, or elbow. Symptoms of an attack may include: Very bad pain. Warmth. Swelling. Stiffness. Shiny, red, or purple skin. Tenderness. The affected joint may be very painful to touch. Chills and fever. Chronic gout may cause symptoms more often. More joints may be involved. You may also have white or yellow lumps (tophi) on your hands or feet or in other areas near your joints.   How is this treated? Treatment for this condition has two phases: treating an acute attack and preventing future attacks. Acute gout treatment may include: NSAIDs. Steroids. These are taken by mouth or injected into a joint. Colchicine. This medicine relieves pain and swelling. It can be given by mouth or through an IV tube. Preventive treatment may include: Taking small doses of NSAIDs  or colchicine daily. Using a medicine that reduces uric acid levels in your blood. Making changes to your diet. You may need to see a food expert (dietitian) about what to eat and drink to prevent gout. Follow these instructions at home: During a gout attack If told, put ice on the painful area: Put ice in a plastic bag. Place a towel between your skin and the bag. Leave the ice on for 20 minutes, 2-3 times a day. Raise (elevate) the  painful joint above the level of your heart as often as you can. Rest the joint as much as possible. If the joint is in your leg, you may be given crutches. Follow instructions from your doctor about what you cannot eat or drink.   Avoiding future gout attacks Eat a low-purine diet. Avoid foods and drinks such as: Liver. Kidney. Anchovies. Asparagus. Herring. Mushrooms. Mussels. Beer. Stay at a healthy weight. If you want to lose weight, talk with your doctor. Do not lose weight too fast. Start or continue an exercise plan as told by your doctor. Eating and drinking Drink enough fluids to keep your pee (urine) pale yellow. If you drink alcohol: Limit how much you use to: 0-1 drink a day for women. 0-2 drinks a day for men. Be aware of how much alcohol is in your drink. In the [place] one drink equals one 12 oz bottle of beer (355 mL), one 5 oz glass of wine (148 mL), or one 1 oz glass of hard liquor (44 mL). General instructions Take over-the-counter and prescription medicines only as told by your doctor. Do not drive or use heavy machinery while taking prescription pain medicine. Return to your normal activities as told by your doctor. Ask your doctor what activities are safe for you. Keep all follow-up visits as told by your doctor. This is important. Contact a doctor if: You have another gout attack. You still have symptoms of a gout attack after 10 days of treatment. You have problems (side effects) because of your medicines. You have chills or a fever. You have burning pain when you pee (urinate). You have pain in your lower back or belly. Get help right away if: You have very bad pain. Your pain cannot be controlled. You cannot pee. Summary Gout is painful swelling of the joints. The most common site of pain is the big toe, but it can affect other joints. Medicines and avoiding some foods can help to prevent and treat gout attacks. This information is not intended to  replace advice given to you by your health care provider. Make sure you discuss any questions you have with your health care provider. Document Revised: 10/28/2017 Document Reviewed: 10/28/2017 Elsevier Patient Education  [place].

## 2020-10-02 NOTE — Progress Notes (Signed)
Subjective: 65 year old female presents the office today for concerns of right foot pain, swelling.  She states that she has a history of gout.  Recently had a uric acid level that was elevated.  Still getting discomfort on the big toe joint she reports.  She states the pain can start back last week.  No recent injury or trauma. Denies any systemic complaints such as fevers, chills, nausea, vomiting. No acute changes since last appointment, and no other complaints at this time.   Objective: AAO x3, NAD DP/PT pulses palpable bilaterally, CRT less than 3 seconds There is edema localized on the first MPJ and tenderness palpation with faint warmth present on the right side.  Mild discomfort MPJ range of motion.  No open lesions there is no fluctuation.  No other areas of discomfort identified today. No pain with calf compression, swelling, warmth, erythema  Assessment: Right first MPJ capsulitis, likely gout  Plan: -All treatment options discussed with the patient including all alternatives, risks, complications.  -X-rays obtained reviewed.  No evidence of acute fracture. -Steroid injection performed. Skin was prepped with Betadine, alcohol and mixture of 1 cc, 10, 1 cc lidocaine plain was infiltrated into and around the first MPJ without complications.  Postinjection care discussed. -Likely benefit from allopurinol given repeated flares however need to discuss with primary care given her kidney function. -Patient encouraged to call the office with any questions, concerns, change in symptoms.   Trula Slade DPM

## 2020-11-22 ENCOUNTER — Ambulatory Visit: Payer: 59 | Admitting: Family Medicine

## 2020-11-22 ENCOUNTER — Encounter: Payer: Self-pay | Admitting: Family Medicine

## 2020-11-22 DIAGNOSIS — F1190 Opioid use, unspecified, uncomplicated: Secondary | ICD-10-CM | POA: Insufficient documentation

## 2020-11-29 ENCOUNTER — Ambulatory Visit (INDEPENDENT_AMBULATORY_CARE_PROVIDER_SITE_OTHER): Payer: 59 | Admitting: Family Medicine

## 2020-11-29 ENCOUNTER — Other Ambulatory Visit: Payer: Self-pay

## 2020-11-29 VITALS — BP 110/68 | HR 83 | Ht 65.0 in | Wt 148.4 lb

## 2020-11-29 DIAGNOSIS — Z1211 Encounter for screening for malignant neoplasm of colon: Secondary | ICD-10-CM | POA: Diagnosis not present

## 2020-11-29 DIAGNOSIS — M1A0710 Idiopathic chronic gout, right ankle and foot, without tophus (tophi): Secondary | ICD-10-CM | POA: Diagnosis not present

## 2020-11-29 DIAGNOSIS — M25551 Pain in right hip: Secondary | ICD-10-CM | POA: Diagnosis not present

## 2020-11-29 DIAGNOSIS — Z86010 Personal history of colonic polyps: Secondary | ICD-10-CM

## 2020-11-29 DIAGNOSIS — G8929 Other chronic pain: Secondary | ICD-10-CM

## 2020-11-29 NOTE — Patient Instructions (Addendum)
It was good to see you today.  Thank you for coming in.  I think you have arthritis.  I recommend following up with [facility] on 8/31 and Orthopedics for further imaging and management of your hip pain.  You will be due for a colonoscopy in November of this year.  I placed a referral to have this scheduled.  I want you to follow-up with your PCP in 1 month for management of your gout and kidneys.  Be Well, Dr Manus Rudd

## 2020-11-29 NOTE — Progress Notes (Signed)
    SUBJECTIVE:   CHIEF COMPLAINT / HPI: Right hip pain  Patient has problem since 2002.  Indicates has recently become worse.  Has had previous imaging of hip and back.  Seen at Pain clinic for pain and currently taking Percocet 7.5-325 3-4 times a day.  Has had recent imaging performed by work at [employer].  Also seen by [facility] every 3 months, but have not received latest imaging from [employer].  Has appointment coming up on 8/31.  PERTINENT  PMH / PSH: Chronic Back and Right hip pain, gout  OBJECTIVE:   BP 110/68   Pulse 83   Ht 5\' 5"  (1.651 m)   Wt 148 lb 6 oz (67.3 kg)   SpO2 98%   BMI 24.69 kg/m    Physical Exam Abdominal:     Tenderness: no abdominal tenderness     Hernia: No hernia is present.  Neurological:     Motor: No weakness.     Coordination: Coordination normal.     Gait: Gait abnormal.     Comments: Able to ambulate to door and back without use of cane, slight favoring of left leg, no evidence of joint swelling     ASSESSMENT/PLAN:   Hip pain, chronic, right No acute concerning findings - Follow-up with [facility] on 8/31.  Recommend contacting [facility] to access hip imaging to bring or fax over for appointment.  Hx of colonic polyps Had colon polyps found and removed on colonoscopy in 02/2018 and recommended to have repeat colonoscopy in 3 years.  Will place referral for follow-up colonoscopy for 02/2021. - Referral to GI for colonoscopy  Gout Not currently on preventative medication.  Will likely need close observation for medication management given kidney disease. - follow up with PCP for prophylactic medication discussion     Delora Fuel, MD [place]

## 2020-11-30 DIAGNOSIS — Z86010 Personal history of colonic polyps: Secondary | ICD-10-CM | POA: Insufficient documentation

## 2020-11-30 NOTE — Assessment & Plan Note (Signed)
Had colon polyps found and removed on colonoscopy in 02/2018 and recommended to have repeat colonoscopy in 3 years.  Will place referral for follow-up colonoscopy for 02/2021. - Referral to GI for colonoscopy

## 2020-11-30 NOTE — Assessment & Plan Note (Addendum)
Not currently on preventative medication.  Will likely need close observation for medication management given kidney disease.  Patient indicates has been seen by Nephrology though unsure if taking medication for prevention. - follow up with PCP for prophylactic medication discussion

## 2020-11-30 NOTE — Assessment & Plan Note (Signed)
No acute concerning findings - Follow-up with [facility] on 8/31.  Recommend contacting [facility] to access hip imaging to bring or fax over for appointment.

## 2020-12-07 ENCOUNTER — Telehealth: Payer: Self-pay | Admitting: Family Medicine

## 2020-12-07 NOTE — Telephone Encounter (Signed)
Certificate of Disability form dropped off for at front desk for completion.  Verified that patient section of form has been completed.  Last DOS/WCC with PCP was 11/29/20.  Placed form in team folder to be completed by clinical staff.  Creig Hines

## 2020-12-10 NOTE — Telephone Encounter (Signed)
Reviewed form and placed in PCP's box for completion.  .Donnamarie Shankles R Audrey Eller, CMA  

## 2020-12-11 NOTE — Telephone Encounter (Signed)
Form signed and placed in triage box.   Gerlene Fee, DO 12/11/2020, 3:56 PM PGY-3, [place]

## 2020-12-12 NOTE — Telephone Encounter (Signed)
Attempted to call patient to inform of form ready for pick up. However, no answer or option for VM.  Form has been placed up front for pick up.   Copy made for batch scanning.

## 2020-12-17 ENCOUNTER — Encounter: Payer: Self-pay | Admitting: Internal Medicine

## 2020-12-20 ENCOUNTER — Other Ambulatory Visit: Payer: Self-pay | Admitting: Student in an Organized Health Care Education/Training Program

## 2020-12-21 ENCOUNTER — Other Ambulatory Visit: Payer: Self-pay | Admitting: Nephrology

## 2020-12-21 DIAGNOSIS — N02B9 Other recurrent and persistent immunoglobulin A nephropathy: Secondary | ICD-10-CM

## 2020-12-21 DIAGNOSIS — N028 Recurrent and persistent hematuria with other morphologic changes: Secondary | ICD-10-CM

## 2020-12-21 DIAGNOSIS — N1831 Chronic kidney disease, stage 3a: Secondary | ICD-10-CM

## 2020-12-26 ENCOUNTER — Ambulatory Visit
Admission: RE | Admit: 2020-12-26 | Discharge: 2020-12-26 | Disposition: A | Discharge status: Home / Self Care | Payer: 59 | Source: Ambulatory Visit | Attending: Nephrology | Admitting: Nephrology

## 2020-12-26 ENCOUNTER — Other Ambulatory Visit: Payer: Self-pay | Admitting: Nephrology

## 2020-12-26 DIAGNOSIS — N281 Cyst of kidney, acquired: Secondary | ICD-10-CM

## 2020-12-26 DIAGNOSIS — N1831 Chronic kidney disease, stage 3a: Secondary | ICD-10-CM

## 2020-12-26 DIAGNOSIS — N028 Recurrent and persistent hematuria with other morphologic changes: Secondary | ICD-10-CM

## 2021-03-07 ENCOUNTER — Telehealth: Payer: Self-pay | Admitting: *Deleted

## 2021-03-07 NOTE — Telephone Encounter (Signed)
Noted- thank you for your response  Will proceed as scheduled

## 2021-03-07 NOTE — Telephone Encounter (Signed)
John,  Can you please review this patient's 2016 surgery and intubation note. Bougie used but ? Due to difficult intubation.   Please advise, Thanks, Lelan Pons

## 2021-03-26 ENCOUNTER — Other Ambulatory Visit: Payer: Self-pay

## 2021-03-26 ENCOUNTER — Ambulatory Visit (AMBULATORY_SURGERY_CENTER): Payer: 59 | Admitting: *Deleted

## 2021-03-26 VITALS — Ht 65.0 in | Wt 148.0 lb

## 2021-03-26 DIAGNOSIS — Z86010 Personal history of colonic polyps: Secondary | ICD-10-CM

## 2021-03-26 MED ORDER — PEG 3350-KCL-NA BICARB-NACL 420 G PO SOLR: 4000.0000 mL | Freq: Once | ORAL | 0 refills | Status: AC

## 2021-03-26 NOTE — Progress Notes (Signed)
Patient is here in-person for PV. Patient denies any allergies to eggs or soy. Patient denies any problems with anesthesia/sedation. PONV. Patient is not on any oxygen at home. Patient is not taking any diet/weight loss medications or blood thinners. Patient is aware of our care-partner policy and CJARW-11 safety protocol.   EMMI education assigned to the patient for the procedure, sent to [place].   Patient is COVID-19 vaccinated.

## 2021-04-09 ENCOUNTER — Encounter: Payer: Self-pay | Admitting: Internal Medicine

## 2021-04-09 ENCOUNTER — Ambulatory Visit (AMBULATORY_SURGERY_CENTER): Payer: 59 | Admitting: Internal Medicine

## 2021-04-09 ENCOUNTER — Other Ambulatory Visit: Payer: Self-pay

## 2021-04-09 VITALS — BP 146/70 | HR 63 | Temp 98.4°F | Resp 16 | Ht 65.0 in | Wt 148.0 lb

## 2021-04-09 DIAGNOSIS — D123 Benign neoplasm of transverse colon: Secondary | ICD-10-CM

## 2021-04-09 DIAGNOSIS — Z86010 Personal history of colonic polyps: Secondary | ICD-10-CM | POA: Diagnosis present

## 2021-04-09 DIAGNOSIS — K635 Polyp of colon: Secondary | ICD-10-CM | POA: Diagnosis not present

## 2021-04-09 MED ORDER — SODIUM CHLORIDE 0.9 % IV SOLN: 500.0000 mL | Freq: Once | INTRAVENOUS | Status: AC

## 2021-04-09 NOTE — Progress Notes (Signed)
C.W. vital signs. 

## 2021-04-09 NOTE — Progress Notes (Signed)
A and O x3. Report to RN. Tolerated MAC anesthesia well.

## 2021-04-09 NOTE — Patient Instructions (Signed)
Handouts on polyps & diverticulosis  given to you today Await pathology results on polyp removed    YOU HAD AN ENDOSCOPIC PROCEDURE TODAY AT [facility]:   Refer to the procedure report that was given to you for any specific questions about what was found during the examination.  If the procedure report does not answer your questions, please call your gastroenterologist to clarify.  If you requested that your care partner not be given the details of your procedure findings, then the procedure report has been included in a sealed envelope for you to review at your convenience later.  YOU SHOULD EXPECT: Some feelings of bloating in the abdomen. Passage of more gas than usual.  Walking can help get rid of the air that was put into your GI tract during the procedure and reduce the bloating. If you had a lower endoscopy (such as a colonoscopy or flexible sigmoidoscopy) you may notice spotting of blood in your stool or on the toilet paper. If you underwent a bowel prep for your procedure, you may not have a normal bowel movement for a few days.  Please Note:  You might notice some irritation and congestion in your nose or some drainage.  This is from the oxygen used during your procedure.  There is no need for concern and it should clear up in a day or so.  SYMPTOMS TO REPORT IMMEDIATELY:  Following lower endoscopy (colonoscopy or flexible sigmoidoscopy):  Excessive amounts of blood in the stool  Significant tenderness or worsening of abdominal pains  Swelling of the abdomen that is new, acute  Fever of 100F or higher   For urgent or emergent issues, a gastroenterologist can be reached at any hour by calling [phone]. Do not use MyChart messaging for urgent concerns.    DIET:  We do recommend a small meal at first, but then you may proceed to your regular diet.  Drink plenty of fluids but you should avoid alcoholic beverages for 24 hours.  ACTIVITY:  You should plan to  take it easy for the rest of today and you should NOT DRIVE or use heavy machinery until tomorrow (because of the sedation medicines used during the test).    FOLLOW UP: Our staff will call the number listed on your records 48-72 hours following your procedure to check on you and address any questions or concerns that you may have regarding the information given to you following your procedure. If we do not reach you, we will leave a message.  We will attempt to reach you two times.  During this call, we will ask if you have developed any symptoms of COVID 19. If you develop any symptoms (ie: fever, flu-like symptoms, shortness of breath, cough etc.) before then, please call [phone].  If you test positive for Covid 19 in the 2 weeks post procedure, please call and report this information to [place].    If any biopsies were taken you will be contacted by phone or by letter within the next 1-3 weeks.  Please call us at [phone] if you have not heard about the biopsies in 3 weeks.    SIGNATURES/CONFIDENTIALITY: You and/or your care partner have signed paperwork which will be entered into your electronic medical record.  These signatures attest to the fact that that the information above on your After Visit Summary has been reviewed and is understood.  Full responsibility of the confidentiality of this discharge information lies with you and/or your  care-partner.

## 2021-04-09 NOTE — Op Note (Signed)
[facility] Patient Name: [name] Procedure Date: 04/09/2021 11:25 AM MRN: [id number] Endoscopist: Docia Chuck. Henrene Pastor , MD Age: 65 Referring MD:  Date of Birth: 13-Aug-1955 Gender: Female Account #: [id number] Procedure:                Colonoscopy with cold snare polypectomy x 1 Indications:              High risk colon cancer surveillance: Personal                            history of adenoma (10 mm or greater in size), High                            risk colon cancer surveillance: Personal history of                            adenoma with villous component, High risk colon                            cancer surveillance: Personal history of multiple                            (3 or more) adenomas. Previous examinations 2016,                            2019 Medicines:                Monitored Anesthesia Care Procedure:                Pre-Anesthesia Assessment:                           - Prior to the procedure, a History and Physical                            was performed, and patient medications and                            allergies were reviewed. The patient's tolerance of                            previous anesthesia was also reviewed. The risks                            and benefits of the procedure and the sedation                            options and risks were discussed with the patient.                            All questions were answered, and informed consent                            was obtained. Prior Anticoagulants: The patient has  taken no previous anticoagulant or antiplatelet                            agents. ASA Grade Assessment: II - A patient with                            mild systemic disease. After reviewing the risks                            and benefits, the patient was deemed in                            satisfactory condition to undergo the procedure.                           After obtaining informed  consent, the colonoscope                            was passed under direct vision. Throughout the                            procedure, the patient's blood pressure, pulse, and                            oxygen saturations were monitored continuously. The                            Olympus CF-[id number]) Colonoscope was                            introduced through the anus and advanced to the the                            cecum, identified by appendiceal orifice and                            ileocecal valve. The ileocecal valve, appendiceal                            orifice, and rectum were photographed. The quality                            of the bowel preparation was excellent. The                            colonoscopy was performed without difficulty. The                            patient tolerated the procedure well. The bowel                            preparation used was SUPREP via split dose  instruction. Scope In: 11:56:28 AM Scope Out: 12:08:48 PM Scope Withdrawal Time: 0 hours 10 minutes 17 seconds  Total Procedure Duration: 0 hours 12 minutes 20 seconds  Findings:                 A 6 mm polyp was found in the transverse colon. The                            polyp was sessile. The polyp was removed with a                            cold snare. Resection and retrieval were complete.                           Multiple small and large-mouthed diverticula were                            found in the left colon (marked changes) and right                            colon. Internal hemorrhoids.                           The exam was otherwise without abnormality on                            direct and retroflexion views. Complications:            No immediate complications. Estimated blood loss:                            None. Estimated Blood Loss:     Estimated blood loss: none. Impression:               - One 6 mm polyp in the transverse  colon, removed                            with a cold snare. Resected and retrieved.                           - Diverticulosis in the left colon (March changes)                            and in the right colon. Internal hemorrhoids.                           - The examination was otherwise normal on direct                            and retroflexion views. Recommendation:           - Repeat colonoscopy in 5 years for surveillance.                           - Patient has a contact number available for  emergencies. The signs and symptoms of potential                            delayed complications were discussed with the                            patient. Return to normal activities tomorrow.                            Written discharge instructions were provided to the                            patient.                           - Resume previous diet.                           - Continue present medications.                           - Await pathology results. Docia Chuck. Henrene Pastor, MD 04/09/2021 12:17:47 PM This report has been signed electronically.

## 2021-04-09 NOTE — Progress Notes (Signed)
Called to room to assist during endoscopic procedure.  Patient ID and intended procedure confirmed with present staff. Received instructions for my participation in the procedure from the performing physician.  

## 2021-04-09 NOTE — Progress Notes (Signed)
Pt's states no medical or surgical changes since previsit or office visit. 

## 2021-04-11 ENCOUNTER — Telehealth: Payer: Self-pay

## 2021-04-11 ENCOUNTER — Encounter: Payer: Self-pay | Admitting: Internal Medicine

## 2021-04-11 NOTE — Telephone Encounter (Signed)
°  Follow up Call-  Call back number 04/09/2021  Post procedure Call Back phone  # [phone]  Permission to leave phone message Yes  Some recent data might be hidden     Patient questions:  Do you have a fever, pain , or abdominal swelling? No. Pain Score  0 *  Have you tolerated food without any problems? Yes.    Have you been able to return to your normal activities? Yes.    Do you have any questions about your discharge instructions: Diet   No. Medications  No. Follow up visit  No.  Do you have questions or concerns about your Care? No.  Actions: * If pain score is 4 or above: No action needed, pain <4.  Have you developed a fever since your procedure? no  2.   Have you had an respiratory symptoms (SOB or cough) since your procedure? no  3.   Have you tested positive for COVID 19 since your procedure no  4.   Have you had any family members/close contacts diagnosed with the COVID 19 since your procedure?  no   If yes to any of these questions please route to Joylene John, RN and Joella Prince, RN

## 2021-04-11 NOTE — Telephone Encounter (Signed)
°  Follow up Call-  Call back number 04/09/2021  Post procedure Call Back phone  # [phone]  Permission to leave phone message Yes  Some recent data might be hidden     Left message

## 2021-05-15 ENCOUNTER — Other Ambulatory Visit: Payer: Self-pay | Admitting: Family Medicine

## 2021-09-24 ENCOUNTER — Encounter: Payer: Self-pay | Admitting: *Deleted

## 2021-11-06 NOTE — Progress Notes (Signed)
Cardiology Office Note:    Date:  11/07/2021   ID:  [name], DOB Apr 25, 1955, MRN [id number]  PCP:  Eppie Gibson, MD   [place] Providers Cardiologist:  Lenna Sciara, MD Referring MD: Michael Boston, MD   Chief Complaint/Reason for Referral: Palpitations  ASSESSMENT:    1. Palpitations   2. CKD (chronic kidney disease) stage 4, GFR 15-29 ml/min (HCC)   3. Aortic atherosclerosis ([place])   4. Hyperlipidemia, unspecified hyperlipidemia type   5. Precordial pain     PLAN:    In order of problems listed above: 1.  Palpitations: We will obtain monitor and echocardiogram to evaluate further.  The patient's labs aside from creatinine were within normal limits.  We will keep follow-up open depending on the results of these tests. 2.  Chronic kidney disease stage IV: This is being followed by other providers. 3.  Aortic atherosclerosis: We will start aspirin 81 mg and continue atorvastatin 40 mg daily.  We will check LP(a), CMP, and lipid panel next week.  Goal LDL is less than 70 by virtue of presence of aortic atherosclerosis. 4.  Hyperlipidemia: See #3 above. 5.  Chest pain: We will obtain a PET stress test to evaluate further.  Coronary CTA is not a good idea given her stage IV kidney disease.              Dispo:  Return if symptoms worsen or fail to improve.      Medication Adjustments/Labs and Tests Ordered: Current medicines are reviewed at length with the patient today.  Concerns regarding medicines are outlined above.  The following changes have been made:     Labs/tests ordered: Orders Placed This Encounter  Procedures   EKG 12-Lead    Medication Changes: No orders of the defined types were placed in this encounter.    Current medicines are reviewed at length with the patient today.  The patient does not have concerns regarding medicines.   History of Present Illness:    FOCUSED PROBLEM LIST:   1.  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 Hollie Salk 2.  GERD 3.  Aortic atherosclerosis on abdomen and pelvis CT 2021  The patient is a 66 y.o. female with the indicated medical history here for recommendations regarding palpitations.  The patient was seen by her primary care provider recently with complaints of palpitations.  Labs were drawn which were significant for creatinine of 2.4 and a GFR in the 20s.  Her TSH and hemoglobin were within normal limits.  Her LDL was 152.  The patient tells me that she has developed palpitations especially when she lays on her left side.  They occasionally occur when she exerts herself.  She occasionally gets chest tightness when she gets exerts herself as well.  The chest tightness happens when she lays down to.  She does have some orthopnea and paroxysmal nocturnal dyspnea.  She does complain of some peripheral edema that is worse when she is on her feet for a long time.  She has required no emergency room visits or hospitalizations recently.  She has been following with nephrology for quite a long time due to IgA nephropathy.  Unfortunately she was not able to tolerate losartan due to low blood pressures.  She tells me that her kidney function has worsened over the last few years and dialysis is likely in her near future.          Current Medications:  Current Meds  Medication Sig   oxyCODONE-acetaminophen (PERCOCET) 7.5-325 MG tablet take 1 tablet by oral route  every 8 hours as needed (DNF 11/23/20)   pantoprazole (PROTONIX) 20 MG tablet TAKE 1 TABLET(20 MG) BY MOUTH DAILY   Current Facility-Administered Medications for the 11/07/21 encounter (Office Visit) with Early Osmond, MD  Medication   0.9 %  sodium chloride infusion     Allergies:    Sulfa antibiotics and Sulfamethoxazole   Social History:   Social History   Tobacco Use   Smoking status: Never   Smokeless tobacco: Never  Vaping Use   Vaping Use: Never used  Substance Use Topics   Alcohol use: No     Alcohol/week: 0.0 standard drinks of alcohol   Drug use: No     Family Hx: Family History  Problem Relation Age of Onset   Kidney disease Mother    Kidney disease Maternal Uncle    Kidney disease Cousin    Esophageal cancer Maternal Uncle    Colon cancer Neg Hx    Colon polyps Neg Hx    Rectal cancer Neg Hx    Stomach cancer Neg Hx      Review of Systems:   Please see the history of present illness.    All other systems reviewed and are negative.     EKGs/Labs/Other Test Reviewed:    EKG:  EKG performed full 2022 that I personally reviewed demonstrates sinus rhythm; EKG performed today that I personally reviewed demonstrates sinus rhythm with PACs and occasional PVCs.  Prior CV studies: None available    Other studies Reviewed: Review of the additional studies/records demonstrates: CT abdomen pelvis 2021 with aortic atherosclerosis  Recent Labs: External facility labs July 2023 demonstrate a sodium 138, potassium 5.6, BUN 44, creatinine 2.4, hemoglobin 12, platelets 261, TSH within normal limits, LFTs within normal limits, total cholesterol 228, triglycerides 79, HDL 60, and LDL 152   Risk Assessment/Calculations:           Physical Exam:    VS:  BP 118/74   Pulse 73   Ht '5\' 5"'$  (1.651 m)   Wt 151 lb 12.8 oz (68.9 kg)   SpO2 99%   BMI 25.26 kg/m    Wt Readings from Last 3 Encounters:  11/07/21 151 lb 12.8 oz (68.9 kg)  04/09/21 148 lb (67.1 kg)  03/26/21 148 lb (67.1 kg)    GENERAL:  No apparent distress, AOx3 HEENT:  No carotid bruits, +2 carotid impulses, no scleral icterus CAR: RRR no murmurs, gallops, rubs, or thrills RES:  Clear to auscultation bilaterally ABD:  Soft, nontender, nondistended, positive bowel sounds x 4 VASC:  +2 radial pulses, +2 carotid pulses, palpable pedal pulses NEURO:  CN 2-12 grossly intact; motor and sensory grossly intact PSYCH:  No active depression or anxiety EXT:  No edema, ecchymosis, or cyanosis  Signed, Early Osmond, MD  11/07/2021 9:49 AM    [address], [place], [place]  [address] Phone: [phone]; Fax: [phone]   Note:  This document was prepared using Dragon voice recognition software and may include unintentional dictation errors.

## 2021-11-07 ENCOUNTER — Encounter: Payer: Self-pay | Admitting: Internal Medicine

## 2021-11-07 ENCOUNTER — Ambulatory Visit (INDEPENDENT_AMBULATORY_CARE_PROVIDER_SITE_OTHER): Payer: Medicare Other | Admitting: Internal Medicine

## 2021-11-07 ENCOUNTER — Ambulatory Visit (INDEPENDENT_AMBULATORY_CARE_PROVIDER_SITE_OTHER): Payer: Medicare Other

## 2021-11-07 VITALS — BP 118/74 | HR 73 | Ht 65.0 in | Wt 151.8 lb

## 2021-11-07 DIAGNOSIS — R002 Palpitations: Secondary | ICD-10-CM

## 2021-11-07 DIAGNOSIS — I10 Essential (primary) hypertension: Secondary | ICD-10-CM

## 2021-11-07 DIAGNOSIS — E785 Hyperlipidemia, unspecified: Secondary | ICD-10-CM | POA: Diagnosis not present

## 2021-11-07 DIAGNOSIS — N184 Chronic kidney disease, stage 4 (severe): Secondary | ICD-10-CM | POA: Diagnosis not present

## 2021-11-07 DIAGNOSIS — I700 Atherosclerosis of aorta: Secondary | ICD-10-CM | POA: Diagnosis not present

## 2021-11-07 DIAGNOSIS — R072 Precordial pain: Secondary | ICD-10-CM

## 2021-11-07 MED ORDER — ASPIRIN 81 MG PO TBEC: 81.0000 mg | DELAYED_RELEASE_TABLET | Freq: Every day | ORAL | 3 refills | Status: DC

## 2021-11-07 NOTE — Patient Instructions (Addendum)
Medication Instructions:  Your physician has recommended you make the following change in your medication:  1.) aspirin 81 mg - take one tablet daily  *If you need a refill on your cardiac medications before your next appointment, please call your pharmacy*   Lab Work: In one week: bmet, lipids, liver, Lp(a)  If you have labs (blood work) drawn today and your tests are completely normal, you will receive your results only by: [place] (if you have MyChart) OR A paper copy in the mail If you have any lab test that is abnormal or we need to change your treatment, we will call you to review the results.   Testing/Procedures: Cardiac PET Stress Test - see instructions below  Zio Heart Monitor - applied today.  Follow-Up: As needed  Other Instructions How to Prepare for Your Cardiac PET/CT Stress Test:  1. Please do not take these medications before your test:   Medications that may interfere with the cardiac pharmacological stress agent (ex. nitrates - including erectile dysfunction medications or beta-blockers) the day of the exam.  Theophylline containing medications for 12 hours. Dipyridamole 48 hours prior to the test. Your remaining medications may be taken with water.  2. Nothing to eat or drink, except water, 3 hours prior to arrival time.   NO caffeine/decaffeinated products, or chocolate 12 hours prior to arrival.  3. NO perfume, cologne or lotion  4. Total time is 1 to 2 hours; you may want to bring reading material for the waiting time.  5. Please report to Admitting at the [facility] Main Entrance 60 minutes early for your test.  [place], [place] [address]  Diabetic Preparation:  Hold oral medications. You may take NPH and Lantus insulin. Do not take Humalog or Humulin R (Regular Insulin) the day of your test. Check blood sugars prior to leaving the house. If able to eat breakfast prior to 3 hour fasting, you may take all  medications, including your insulin, Do not worry if you miss your breakfast dose of insulin - start at your next meal.  IF YOU THINK YOU MAY BE PREGNANT, OR ARE NURSING PLEASE INFORM THE TECHNOLOGIST.  In preparation for your appointment, medication and supplies will be purchased.  Appointment availability is limited, so if you need to cancel or reschedule, please call the Radiology Department at [phone]  24 hours in advance to avoid a cancellation fee of $100.00  What to Expect After you Arrive:  Once you arrive and check in for your appointment, you will be taken to a preparation room within the Radiology Department.  A technologist or Nurse will obtain your medical history, verify that you are correctly prepped for the exam, and explain the procedure.  Afterwards,  an IV will be started in your arm and electrodes will be placed on your skin for EKG monitoring during the stress portion of the exam. Then you will be escorted to the PET/CT scanner.  There, staff will get you positioned on the scanner and obtain a blood pressure and EKG.  During the exam, you will continue to be connected to the EKG and blood pressure machines.  A small, safe amount of a radioactive tracer will be injected in your IV to obtain a series of pictures of your heart along with an injection of a stress agent.    After your Exam:  It is recommended that you eat a meal and drink a caffeinated beverage to counter act any effects  of the stress agent.  Drink plenty of fluids for the remainder of the day and urinate frequently for the first couple of hours after the exam.  Your doctor will inform you of your test results within 7-10 business days.  For questions about your test or how to prepare for your test, please call: Marchia Bond, Cardiac Imaging Nurse Navigator  Gordy Clement, Cardiac Imaging Nurse Navigator Office: [phone]   Important Information About Sugar

## 2021-11-07 NOTE — Progress Notes (Unsigned)
ZIO XT serial # [id number] applied in office.  Dr. Ali Lowe to read.

## 2021-11-14 ENCOUNTER — Other Ambulatory Visit: Payer: Medicare Other

## 2021-11-14 DIAGNOSIS — I700 Atherosclerosis of aorta: Secondary | ICD-10-CM

## 2021-11-14 DIAGNOSIS — R072 Precordial pain: Secondary | ICD-10-CM

## 2021-11-14 DIAGNOSIS — N184 Chronic kidney disease, stage 4 (severe): Secondary | ICD-10-CM

## 2021-11-14 DIAGNOSIS — E785 Hyperlipidemia, unspecified: Secondary | ICD-10-CM

## 2021-11-14 DIAGNOSIS — R002 Palpitations: Secondary | ICD-10-CM

## 2021-11-15 LAB — BASIC METABOLIC PANEL
BUN/Creatinine Ratio: 22 (ref 12–28)
BUN: 50 mg/dL — ABNORMAL HIGH (ref 8–27)
CO2: 19 mmol/L — ABNORMAL LOW (ref 20–29)
Calcium: 10 mg/dL (ref 8.7–10.3)
Chloride: 108 mmol/L — ABNORMAL HIGH (ref 96–106)
Creatinine, Ser: 2.31 mg/dL — ABNORMAL HIGH (ref 0.57–1.00)
Glucose: 96 mg/dL (ref 70–99)
Potassium: 4.9 mmol/L (ref 3.5–5.2)
Sodium: 141 mmol/L (ref 134–144)
eGFR: 23 mL/min/{1.73_m2} — ABNORMAL LOW (ref >59)

## 2021-11-15 LAB — HEPATIC FUNCTION PANEL
ALT: 16 IU/L (ref 0–32)
AST: 27 IU/L (ref 0–40)
Albumin: 4.1 g/dL (ref 3.9–4.9)
Alkaline Phosphatase: 77 IU/L (ref 44–121)
Bilirubin Total: 0.2 mg/dL (ref 0.0–1.2)
Bilirubin, Direct: <0.1 mg/dL (ref 0.00–0.40)
Total Protein: 6.5 g/dL (ref 6.0–8.5)

## 2021-11-15 LAB — LIPID PANEL
Chol/HDL Ratio: 2.6 ratio (ref 0.0–4.4)
Cholesterol, Total: 167 mg/dL (ref 100–199)
HDL: 65 mg/dL (ref >39)
LDL Chol Calc (NIH): 83 mg/dL (ref 0–99)
Triglycerides: 103 mg/dL (ref 0–149)
VLDL Cholesterol Cal: 19 mg/dL (ref 5–40)

## 2021-11-15 LAB — LIPOPROTEIN A (LPA): Lipoprotein (a): 109.5 nmol/L — ABNORMAL HIGH (ref <75.0)

## 2021-11-20 ENCOUNTER — Ambulatory Visit (HOSPITAL_COMMUNITY): Payer: Medicare Other | Attending: Internal Medicine

## 2021-11-20 DIAGNOSIS — R002 Palpitations: Secondary | ICD-10-CM | POA: Diagnosis not present

## 2021-11-20 DIAGNOSIS — E785 Hyperlipidemia, unspecified: Secondary | ICD-10-CM | POA: Diagnosis present

## 2021-11-20 DIAGNOSIS — N184 Chronic kidney disease, stage 4 (severe): Secondary | ICD-10-CM | POA: Insufficient documentation

## 2021-11-20 DIAGNOSIS — R072 Precordial pain: Secondary | ICD-10-CM | POA: Insufficient documentation

## 2021-11-20 DIAGNOSIS — I700 Atherosclerosis of aorta: Secondary | ICD-10-CM | POA: Diagnosis not present

## 2021-11-20 LAB — ECHOCARDIOGRAM COMPLETE
Area-P 1/2: 3.6 cm2
S' Lateral: 2 cm

## 2021-11-21 ENCOUNTER — Telehealth: Payer: Self-pay | Admitting: *Deleted

## 2021-11-21 DIAGNOSIS — E785 Hyperlipidemia, unspecified: Secondary | ICD-10-CM

## 2021-11-21 DIAGNOSIS — R002 Palpitations: Secondary | ICD-10-CM | POA: Diagnosis not present

## 2021-11-21 MED ORDER — ATORVASTATIN CALCIUM 80 MG PO TABS: 80.0000 mg | ORAL_TABLET | Freq: Every day | ORAL | 3 refills | Status: DC

## 2021-11-21 NOTE — Telephone Encounter (Signed)
-----   Message from Early Osmond, MD sent at 11/16/2021  6:50 AM EDT ----- Please increase atorva to 80 and check lipid panel and LFTs in 2 months; please refer to Dr Debara Pickett re elevated Lp(a)

## 2021-11-21 NOTE — Telephone Encounter (Signed)
Left message for patient to call back.  Forwarding information in [employer].

## 2021-11-28 ENCOUNTER — Encounter: Payer: Self-pay | Admitting: *Deleted

## 2021-11-28 NOTE — Telephone Encounter (Signed)
Pt has not read the MyChart message.  VM box is full. I am unable to leave a message.  I will send a letter for her to call to review rec's and plan.

## 2022-01-24 ENCOUNTER — Ambulatory Visit: Payer: Medicare Other | Attending: Internal Medicine

## 2022-01-24 ENCOUNTER — Telehealth: Payer: Self-pay | Admitting: Internal Medicine

## 2022-01-24 DIAGNOSIS — E785 Hyperlipidemia, unspecified: Secondary | ICD-10-CM

## 2022-01-24 NOTE — Telephone Encounter (Signed)
Called [facility]  Last notes scanned into chart are from Dr. Hollie Salk in March 2023.  Pt was seen there last 12/12/21, they will fax notes for review.

## 2022-01-24 NOTE — Telephone Encounter (Signed)
Early Osmond, MD to Me   AT   01/24/22  1:14 PM OK, let's stop the atorvastatin and believe she is seeing Dr. Debara Pickett in the future.   __________________________________________________________________  Called patient and let her know to stop atorvastatin and plan to discuss with Dr. Debara Pickett next week.  Pt voices understanding and agreement. Her lipids were drawn this am.

## 2022-01-24 NOTE — Telephone Encounter (Signed)
Patient is requesting medication change in regards to her cholesterol. Her kidney doctor has informed her that her current medication is causing damage to her kidney's. The kidney doctor recommends Repatha or Pralvent as the drug of choice for her cholesterol.

## 2022-01-25 LAB — LIPID PANEL
Chol/HDL Ratio: 3.4 ratio (ref 0.0–4.4)
Cholesterol, Total: 248 mg/dL — ABNORMAL HIGH (ref 100–199)
HDL: 73 mg/dL (ref >39)
LDL Chol Calc (NIH): 156 mg/dL — ABNORMAL HIGH (ref 0–99)
Triglycerides: 110 mg/dL (ref 0–149)
VLDL Cholesterol Cal: 19 mg/dL (ref 5–40)

## 2022-01-25 LAB — HEPATIC FUNCTION PANEL
ALT: 14 IU/L (ref 0–32)
AST: 19 IU/L (ref 0–40)
Albumin: 4.4 g/dL (ref 3.9–4.9)
Alkaline Phosphatase: 82 IU/L (ref 44–121)
Bilirubin Total: 0.3 mg/dL (ref 0.0–1.2)
Bilirubin, Direct: <0.1 mg/dL (ref 0.00–0.40)
Total Protein: 7 g/dL (ref 6.0–8.5)

## 2022-01-28 ENCOUNTER — Encounter (HOSPITAL_BASED_OUTPATIENT_CLINIC_OR_DEPARTMENT_OTHER): Payer: Self-pay | Admitting: Internal Medicine

## 2022-01-28 ENCOUNTER — Ambulatory Visit (INDEPENDENT_AMBULATORY_CARE_PROVIDER_SITE_OTHER): Payer: Medicare Other | Admitting: Internal Medicine

## 2022-01-28 VITALS — BP 140/84 | HR 65 | Ht 65.0 in | Wt 153.4 lb

## 2022-01-28 DIAGNOSIS — Z79899 Other long term (current) drug therapy: Secondary | ICD-10-CM | POA: Diagnosis not present

## 2022-01-28 DIAGNOSIS — E785 Hyperlipidemia, unspecified: Secondary | ICD-10-CM | POA: Diagnosis not present

## 2022-01-28 DIAGNOSIS — I700 Atherosclerosis of aorta: Secondary | ICD-10-CM

## 2022-01-28 DIAGNOSIS — N184 Chronic kidney disease, stage 4 (severe): Secondary | ICD-10-CM | POA: Diagnosis not present

## 2022-01-28 DIAGNOSIS — E7841 Elevated Lipoprotein(a): Secondary | ICD-10-CM

## 2022-01-28 MED ORDER — ROSUVASTATIN CALCIUM 5 MG PO TABS: 5.0000 mg | ORAL_TABLET | Freq: Every day | ORAL | 11 refills | Status: DC

## 2022-01-28 NOTE — Patient Instructions (Signed)
Medication Instructions:  START: Rosuvastatin (Crestor) 5 mg daily  *If you need a refill on your cardiac medications before your next appointment, please call your pharmacy*   Lab Work: Your provider has recommended lab work in January, . Please have this collected at [facility]. The lab is open 8:00 am - 4:30 pm. Please avoid 12:00p - 1:00p for lunch hour. You do not need an appointment. Please go to [address] [place], [place] [address]. This is in the Primary Care office on the 3rd floor, let them know you are there for blood work and they will direct you to the lab.  If you have labs (blood work) drawn today and your tests are completely normal, you will receive your results only by: [place] (if you have MyChart) OR A paper copy in the mail If you have any lab test that is abnormal or we need to change your treatment, we will call you to review the results.   Testing/Procedures: None ordered today   Follow-Up: At [facility], you and your health needs are our priority.  As part of our continuing mission to provide you with exceptional heart care, we have created designated Provider Care Teams.  These Care Teams include your primary Cardiologist (physician) and Advanced Practice Providers (APPs -  Physician Assistants and Nurse Practitioners) who all work together to provide you with the care you need, when you need it.  We recommend signing up for the patient portal called "MyChart".  Sign up information is provided on this After Visit Summary.  MyChart is used to connect with patients for Virtual Visits (Telemedicine).  Patients are able to view lab/test results, encounter notes, upcoming appointments, etc.  Non-urgent messages can be sent to your provider as well.   To learn more about what you can do with MyChart, go to [email or web address].    Your next appointment:   3 month(s)  The format for your next appointment:    In Person  Provider:   K. Mali Hilty, MD

## 2022-01-28 NOTE — Progress Notes (Signed)
LIPID CLINIC CONSULT NOTE  Chief Complaint:  Manage dyslipidemia  Primary Care Physician: Eppie Gibson, MD  Primary Cardiologist:  Early Osmond, MD  HPI:  [name] is a 66 y.o. female who is being seen today for the evaluation of dyslipidemia at the request of Thukkani, Arlie Solomons, MD. this is a pleasant 66 year old female kindly referred by Dr. Ali Lowe for evaluation and management of dyslipidemia.  He had recently seen her in the office for palpitations and noted she has some aortic atherosclerosis.  She was started on aspirin and it was recommended she continue on atorvastatin 40 mg daily.  An LP(a) was recommended -this resulted abnormal at 109.5 nmol/L, however her LP(a) was also assessed 8 years ago and was 34 mg/dL, slightly elevated (measured in mass versus particle number), so difficult to compare these values.  She was also complaining of some chest pain and a PET scan was ordered.  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  According to her nephrologist note, she had been intolerant of atorvastatin and developed cramping which seem to have improved after stopping the medicine which they had advised.  She has now been off of statins for several months.  She had repeat lipids on January 24, 2022, showing total cholesterol 248, HDL 73, triglycerides 110 and LDL 156.  Other than aortic atherosclerosis she has no known coronary or cerebrovascular disease.  She had only previously tried atorvastatin but has not tried other statins.  She has no history of liver enzyme abnormalities on statins.  PMHx:  Past Medical History:  Diagnosis Date   Allergy    Anemia    Annual physical exam 12/21/2018   Arthritis    Blood transfusion without reported diagnosis    Complication of anesthesia    Dysrhythmia    PALPITATIONS WITH CAFFEINE/ [place]   Edema 12/22/2012   GERD (gastroesophageal reflux disease)     Healthcare maintenance 03/17/2012   Heart murmur    Hematuria 01/08/2012   Microscopic  01/06/12    Hip pain, chronic, right 12/05/2009   Qualifier: Diagnosis of  Problem Stop Reason: Removed By: Hyman Hopes     History of hiatal hernia    Hypercalcemia 02/14/2011   Hyperlipidemia    past hx    Hypertension    MIGRAINE, UNSPEC., W/O INTRACTABLE MIGRAINE 06/18/2006   Qualifier: Diagnosis of  By: Darylene Price MD, Mark     Multiple thyroid nodules 04/15/2012   Need for immunization against influenza 04/12/2018   OSTEOPENIA 06/18/2006   Qualifier: Diagnosis of  By: Darylene Price MD, Mark     Osteoporosis    osteopenia   PALPITATIONS, RECURRENT 07/16/2007   PONV (postoperative nausea and vomiting)    Renal cyst 01/08/2012   RENAL INSUFFICIENCY, CHRONIC 06/18/2006   Qualifier: Diagnosis of  By: Darylene Price MD, Mark     RENAL INSUFFICIENCY, CHRONIC 06/18/2006        Right hip pain 04/12/2018   Skin nodule 04/12/2018   Thyroid nodule     Past Surgical History:  Procedure Laterality Date   APPENDECTOMY     COLONOSCOPY  03/17/2018   Dr.Perry   FINGER FRACTURE SURGERY     LAPAROSCOPIC ENDOMETRIOSIS FULGURATION     LASER TO REMOVE BLOOD   NOSE SURGERY     BROKEN   SHOULDER ARTHROSCOPY WITH SUBACROMIAL DECOMPRESSION, ROTATOR CUFF REPAIR AND BICEP TENDON REPAIR Right 11/02/2014   Procedure:  RIGHT SHOULDER ARTHROSCOPY WITH SUBACROMIAL DECOMPRESSION, DISTAL CLAVICLE RESECTION, ROTATOR CUFF REPAIR ;  Surgeon: Justice Britain, MD;  Location: [facility];  Service: Orthopedics;  Laterality: Right;    FAMHx:  Family History  Problem Relation Age of Onset   Kidney disease Mother    Kidney disease Maternal Uncle    Kidney disease Cousin    Esophageal cancer Maternal Uncle    Colon cancer Neg Hx    Colon polyps Neg Hx    Rectal cancer Neg Hx    Stomach cancer Neg Hx     SOCHx:   reports that she has never smoked. She has never used smokeless tobacco. She reports that she does not drink alcohol and does not  use drugs.  ALLERGIES:  Allergies  Allergen Reactions   Sulfa Antibiotics Rash   Sulfamethoxazole Rash    REACTION: unspecified    ROS: Pertinent items noted in HPI and remainder of comprehensive ROS otherwise negative.  HOME MEDS: Current Outpatient Medications on File Prior to Visit  Medication Sig Dispense Refill   aspirin EC 81 MG tablet Take 1 tablet (81 mg total) by mouth daily. Swallow whole. 90 tablet 3   oxyCODONE-acetaminophen (PERCOCET) 7.5-325 MG tablet take 1 tablet by oral route  every 8 hours as needed (DNF 11/23/20) (Patient not taking: Reported on 01/28/2022)     pantoprazole (PROTONIX) 20 MG tablet TAKE 1 TABLET(20 MG) BY MOUTH DAILY (Patient not taking: Reported on 01/28/2022) 90 tablet 1   Current Facility-Administered Medications on File Prior to Visit  Medication Dose Route Frequency Provider Last Rate Last Admin   0.9 %  sodium chloride infusion  500 mL Intravenous Once Irene Shipper, MD        LABS/IMAGING: No results found for this or any previous visit (from the past 48 hour(s)). No results found.  LIPID PANEL:    Component Value Date/Time   CHOL 248 (H) 01/24/2022 0842   CHOL 246 (H) 06/24/2013 1100   TRIG 110 01/24/2022 0842   TRIG 160 (H) 06/24/2013 1100   HDL 73 01/24/2022 0842   HDL 62 06/24/2013 1100   CHOLHDL 3.4 01/24/2022 0842   CHOLHDL 4.3 01/18/2016 1158   VLDL 31 (H) 01/18/2016 1158   LDLCALC 156 (H) 01/24/2022 0842   LDLCALC 152 (H) 06/24/2013 1100   LDLDIRECT 124 (H) 10/21/2006 2036    WEIGHTS: Wt Readings from Last 3 Encounters:  01/28/22 153 lb 6.4 oz (69.6 kg)  11/07/21 151 lb 12.8 oz (68.9 kg)  04/09/21 148 lb (67.1 kg)    VITALS: BP (!) 140/84   Pulse 65   Ht '5\' 5"'$  (1.651 m)   Wt 153 lb 6.4 oz (69.6 kg)   SpO2 99%   BMI 25.53 kg/m   EXAM: Deferred  EKG: Deferred   ASSESSMENT: Mixed dyslipidemia, goal LDL less than 70 Aortic atherosclerosis 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  PLAN: 1.    Ms. Terrero had cramps while on atorvastatin 40 mg daily.  She reports resolution of those after stopping it.  We discussed other options including an alternative statin or nonstatin therapies.  Aortic atherosclerosis alone would not qualify her for PCSK9 inhibitor therapy.  She noted a relative who had done much better on lower dose cholesterol medication.  Therefore we will trial her on low-dose rosuvastatin 5 mg daily.  She also was noted to have an elevated LP(a).  Her LP(a) was not as significantly elevated 8 years ago when she was tested.  The explanation  for this may be her advanced kidney disease.  We also understand that African-Americans can have significantly higher LP(a) levels.  To that end I would like to try her first on statin therapy.  We could of course consider a PCSK9 inhibitor in addition to her statin if she does not reach target and her LP(a) remains elevated.  Plan follow-up with me in about 3 to 4 months with an NMR and LP(a).  Thanks again for the kind referral.  Pixie Casino, MD, FACC, [place] Director of the Advanced Lipid Disorders &  Cardiovascular Risk Reduction Clinic Diplomate of the American Board of Clinical Lipidology Attending Cardiologist  Direct Dial: [phone]  Fax: [phone]  Website:  [email or web address] 01/28/2022, 12:50 PM

## 2022-01-31 ENCOUNTER — Telehealth (HOSPITAL_COMMUNITY): Payer: Self-pay | Admitting: Emergency Medicine

## 2022-01-31 NOTE — Telephone Encounter (Signed)
Attempted to call patient regarding upcoming cardiac PET appointment. Left message on voicemail with name and callback number Laniyah Rosenwald RN Navigator Cardiac Imaging [facility] [phone] Office [phone] Cell  

## 2022-02-03 ENCOUNTER — Telehealth (HOSPITAL_COMMUNITY): Payer: Self-pay | Admitting: *Deleted

## 2022-02-03 NOTE — Telephone Encounter (Signed)
Patient returning call regarding upcoming cardiac imaging study; pt verbalizes understanding of appt date/time, parking situation and where to check in, pre-test NPO status and verified current allergies; name and call back number provided for further questions should they arise  Gordy Clement RN Navigator Cardiac Imaging [facility] [phone] office [phone] cell  Patient aware to avoid caffeine for 12 hours prior to her cardiac PET stress test. She is aware to arrive at 9:30am.

## 2022-02-04 ENCOUNTER — Encounter (HOSPITAL_COMMUNITY)
Admission: RE | Admit: 2022-02-04 | Discharge: 2022-02-04 | Disposition: A | Discharge status: Home / Self Care | Payer: Medicare Other | Source: Ambulatory Visit | Attending: Internal Medicine | Admitting: Internal Medicine

## 2022-02-04 DIAGNOSIS — R072 Precordial pain: Secondary | ICD-10-CM

## 2022-02-04 MED ORDER — REGADENOSON 0.4 MG/5ML IV SOLN: 0.4000 mg | Freq: Once | INTRAVENOUS | Status: AC

## 2022-02-04 MED ORDER — RUBIDIUM RB82 GENERATOR (RUBYFILL)
18.2700 | PACK | Freq: Once | INTRAVENOUS | Status: AC
  Administered 2022-02-04: 18.27 via INTRAVENOUS

## 2022-02-04 MED ORDER — RUBIDIUM RB82 GENERATOR (RUBYFILL)
18.1300 | PACK | Freq: Once | INTRAVENOUS | Status: AC
  Administered 2022-02-04: 18.13 via INTRAVENOUS

## 2022-02-04 MED ORDER — REGADENOSON 0.4 MG/5ML IV SOLN
INTRAVENOUS | Status: AC
  Administered 2022-02-04: 0.4 mg via INTRAVENOUS
  Filled 2022-02-04: qty 5

## 2022-02-05 LAB — NM PET CT CARDIAC PERFUSION MULTI W/ABSOLUTE BLOODFLOW
LV dias vol: 66 mL (ref 46–106)
LV sys vol: 18 mL
MBFR: 3.77
Nuc Rest EF: 67 %
Nuc Stress EF: 73 %
Peak HR: 98 {beats}/min
Rest HR: 63 {beats}/min
Rest MBF: 1.05 ml/g/min
Rest Nuclear Isotope Dose: 18.1 mCi
ST Depression (mm): 0 mm
Stress MBF: 3.96 ml/g/min
Stress Nuclear Isotope Dose: 18.3 mCi
TID: 0.79

## 2022-05-13 ENCOUNTER — Ambulatory Visit: Payer: 59 | Attending: Internal Medicine

## 2022-05-13 ENCOUNTER — Other Ambulatory Visit (HOSPITAL_BASED_OUTPATIENT_CLINIC_OR_DEPARTMENT_OTHER): Payer: Self-pay | Admitting: Internal Medicine

## 2022-05-14 ENCOUNTER — Ambulatory Visit (INDEPENDENT_AMBULATORY_CARE_PROVIDER_SITE_OTHER): Payer: 59 | Admitting: Internal Medicine

## 2022-05-14 ENCOUNTER — Encounter (HOSPITAL_BASED_OUTPATIENT_CLINIC_OR_DEPARTMENT_OTHER): Payer: Self-pay | Admitting: Internal Medicine

## 2022-05-14 VITALS — BP 148/80 | HR 82 | Ht 65.0 in | Wt 157.0 lb

## 2022-05-14 DIAGNOSIS — E785 Hyperlipidemia, unspecified: Secondary | ICD-10-CM | POA: Diagnosis not present

## 2022-05-14 DIAGNOSIS — N184 Chronic kidney disease, stage 4 (severe): Secondary | ICD-10-CM

## 2022-05-14 DIAGNOSIS — E7841 Elevated Lipoprotein(a): Secondary | ICD-10-CM

## 2022-05-14 LAB — LIPOPROTEIN A (LPA): Lipoprotein (a): 87.7 nmol/L — ABNORMAL HIGH (ref <75.0)

## 2022-05-14 NOTE — Patient Instructions (Signed)
Medication Instructions:  NO CHANGES today   *If you need a refill on your cardiac medications before your next appointment, please call your pharmacy*   Lab Work: FASTING lab work to check cholesterol about 1 week before your next visit ** lab order will be mailed  If you have labs (blood work) drawn today and your tests are completely normal, you will receive your results only by: [place] (if you have MyChart) OR A paper copy in the mail If you have any lab test that is abnormal or we need to change your treatment, we will call you to review the results.   Follow-Up: At [facility], you and your health needs are our priority.  As part of our continuing mission to provide you with exceptional heart care, we have created designated Provider Care Teams.  These Care Teams include your primary Cardiologist (physician) and Advanced Practice Providers (APPs -  Physician Assistants and Nurse Practitioners) who all work together to provide you with the care you need, when you need it.  We recommend signing up for the patient portal called "MyChart".  Sign up information is provided on this After Visit Summary.  MyChart is used to connect with patients for Virtual Visits (Telemedicine).  Patients are able to view lab/test results, encounter notes, upcoming appointments, etc.  Non-urgent messages can be sent to your provider as well.   To learn more about what you can do with MyChart, go to [email or web address].    Your next appointment:    6 months with Dr. Debara Pickett

## 2022-05-14 NOTE — Progress Notes (Signed)
LIPID CLINIC CONSULT NOTE  Chief Complaint:  Follow-up dyslipidemia  Primary Care Physician: Eppie Gibson, MD  Primary Cardiologist:  Early Osmond, MD  HPI:  [name] is a 67 y.o. female who is being seen today for the evaluation of dyslipidemia at the request of Eppie Gibson, MD. this is a pleasant 67 year old female kindly referred by Dr. Ali Lowe for evaluation and management of dyslipidemia.  He had recently seen her in the office for palpitations and noted she has some aortic atherosclerosis.  She was started on aspirin and it was recommended she continue on atorvastatin 40 mg daily.  An LP(a) was recommended -this resulted abnormal at 109.5 nmol/L, however her LP(a) was also assessed 8 years ago and was 34 mg/dL, slightly elevated (measured in mass versus particle number), so difficult to compare these values.  She was also complaining of some chest pain and a PET scan was ordered.  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  According to her nephrologist note, she had been intolerant of atorvastatin and developed cramping which seem to have improved after stopping the medicine which they had advised.  She has now been off of statins for several months.  She had repeat lipids on January 24, 2022, showing total cholesterol 248, HDL 73, triglycerides 110 and LDL 156.  Other than aortic atherosclerosis she has no known coronary or cerebrovascular disease.  She had only previously tried atorvastatin but has not tried other statins.  She has no history of liver enzyme abnormalities on statins.  05/14/2022  [name] returns today for follow-up.  She just had repeat lipid testing yesterday unfortunately the NMR results have not come back.  She did have a reduction in her LP(a) however to 87.7 from 109.5.  This is not likely related to the statin and may be due to other factors including her  chronic kidney disease.  Although, I suspect her cholesterol is lower.  She seems to be tolerating the medicine well without any side effects and says she feels very good.  PMHx:  Past Medical History:  Diagnosis Date   Allergy    Anemia    Annual physical exam 12/21/2018   Arthritis    Blood transfusion without reported diagnosis    Complication of anesthesia    Dysrhythmia    PALPITATIONS WITH CAFFEINE/ [place]   Edema 12/22/2012   GERD (gastroesophageal reflux disease)    Healthcare maintenance 03/17/2012   Heart murmur    Hematuria 01/08/2012   Microscopic  01/06/12    Hip pain, chronic, right 12/05/2009   Qualifier: Diagnosis of  Problem Stop Reason: Removed By: Hyman Hopes     History of hiatal hernia    Hypercalcemia 02/14/2011   Hyperlipidemia    past hx    Hypertension    MIGRAINE, UNSPEC., W/O INTRACTABLE MIGRAINE 06/18/2006   Qualifier: Diagnosis of  By: Darylene Price MD, Mark     Multiple thyroid nodules 04/15/2012   Need for immunization against influenza 04/12/2018   OSTEOPENIA 06/18/2006   Qualifier: Diagnosis of  By: Darylene Price MD, Mark     Osteoporosis    osteopenia   PALPITATIONS, RECURRENT 07/16/2007   PONV (postoperative nausea and vomiting)    Renal cyst 01/08/2012   RENAL INSUFFICIENCY, CHRONIC 06/18/2006   Qualifier: Diagnosis of  By: Darylene Price MD, Mark     RENAL INSUFFICIENCY, CHRONIC 06/18/2006  Right hip pain 04/12/2018   Skin nodule 04/12/2018   Thyroid nodule     Past Surgical History:  Procedure Laterality Date   APPENDECTOMY     COLONOSCOPY  03/17/2018   Dr.Perry   FINGER FRACTURE SURGERY     LAPAROSCOPIC ENDOMETRIOSIS FULGURATION     LASER TO REMOVE BLOOD   NOSE SURGERY     BROKEN   SHOULDER ARTHROSCOPY WITH SUBACROMIAL DECOMPRESSION, ROTATOR CUFF REPAIR AND BICEP TENDON REPAIR Right 11/02/2014   Procedure: RIGHT SHOULDER ARTHROSCOPY WITH SUBACROMIAL DECOMPRESSION, DISTAL CLAVICLE RESECTION, ROTATOR CUFF REPAIR ;  Surgeon: Justice Britain, MD;   Location: [facility];  Service: Orthopedics;  Laterality: Right;    FAMHx:  Family History  Problem Relation Age of Onset   Kidney disease Mother    Kidney disease Maternal Uncle    Kidney disease Cousin    Esophageal cancer Maternal Uncle    Colon cancer Neg Hx    Colon polyps Neg Hx    Rectal cancer Neg Hx    Stomach cancer Neg Hx     SOCHx:   reports that she has never smoked. She has never used smokeless tobacco. She reports that she does not drink alcohol and does not use drugs.  ALLERGIES:  Allergies  Allergen Reactions   Sulfa Antibiotics Rash   Sulfamethoxazole Rash    REACTION: unspecified    ROS: Pertinent items noted in HPI and remainder of comprehensive ROS otherwise negative.  HOME MEDS: Current Outpatient Medications on File Prior to Visit  Medication Sig Dispense Refill   rosuvastatin (CRESTOR) 5 MG tablet Take 1 tablet (5 mg total) by mouth daily. 30 tablet 11   oxyCODONE-acetaminophen (PERCOCET) 7.5-325 MG tablet take 1 tablet by oral route  every 8 hours as needed (DNF 11/23/20) (Patient not taking: Reported on 01/28/2022)     pantoprazole (PROTONIX) 20 MG tablet TAKE 1 TABLET(20 MG) BY MOUTH DAILY (Patient not taking: Reported on 01/28/2022) 90 tablet 1   Current Facility-Administered Medications on File Prior to Visit  Medication Dose Route Frequency Provider Last Rate Last Admin   0.9 %  sodium chloride infusion  500 mL Intravenous Once Irene Shipper, MD        LABS/IMAGING: Results for orders placed or performed in visit on 01/28/22 (from the past 48 hour(s))  Lipoprotein A (LPA)     Status: Abnormal   Collection Time: 05/13/22 11:05 AM  Result Value Ref Range   Lipoprotein (a) 87.7 (H) <75.0 nmol/L    Comment: Note:  Values greater than or equal to 75.0 nmol/L may        indicate an independent risk factor for CHD,        but must be evaluated with caution when applied        to non-Caucasian populations due to the        influence of genetic  factors on Lp(a) across        ethnicities.    No results found.  LIPID PANEL:    Component Value Date/Time   CHOL 248 (H) 01/24/2022 0842   CHOL 246 (H) 06/24/2013 1100   TRIG 110 01/24/2022 0842   TRIG 160 (H) 06/24/2013 1100   HDL 73 01/24/2022 0842   HDL 62 06/24/2013 1100   CHOLHDL 3.4 01/24/2022 0842   CHOLHDL 4.3 01/18/2016 1158   VLDL 31 (H) 01/18/2016 1158   LDLCALC 156 (H) 01/24/2022 0842   LDLCALC 152 (H) 06/24/2013 1100   LDLDIRECT 124 (H) 10/21/2006 2036  WEIGHTS: Wt Readings from Last 3 Encounters:  05/14/22 157 lb (71.2 kg)  01/28/22 153 lb 6.4 oz (69.6 kg)  11/07/21 151 lb 12.8 oz (68.9 kg)    VITALS: BP (!) 148/80   Pulse 82   Ht '5\' 5"'$  (1.651 m)   Wt 157 lb (71.2 kg)   BMI 26.13 kg/m   EXAM: Deferred  EKG: Deferred   ASSESSMENT: Mixed dyslipidemia, goal LDL less than 70 Aortic atherosclerosis 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  PLAN: 1.   Ms. Andujo says that she is not having any further cramps or issues with low-dose rosuvastatin.  LP(a) is lower now at 87.7.  Her additional lipid profile is still pending.  I will contact her with those results and if further adjustments to her medicines need to be made I will do that at that time.  Plan otherwise then follow-up with me in about 4 to 6 months.  Pixie Casino, MD, [facility], [place] Director of the Advanced Lipid Disorders &  Cardiovascular Risk Reduction Clinic Diplomate of the American Board of Clinical Lipidology Attending Cardiologist  Direct Dial: [phone]  Fax: [phone]  Website:  [email or web address] 05/14/2022, 11:03 AM

## 2022-05-15 LAB — NMR, LIPOPROFILE
Cholesterol, Total: 194 mg/dL (ref 100–199)
HDL Particle Number: 36.3 umol/L (ref >=30.5)
HDL-C: 84 mg/dL (ref >39)
LDL Particle Number: 797 nmol/L (ref <1000)
LDL Size: 21.2 nm (ref >20.5)
LDL-C (NIH Calc): 98 mg/dL (ref 0–99)
LP-IR Score: <25 (ref <=45)
Small LDL Particle Number: <90 nmol/L (ref <=527)
Triglycerides: 68 mg/dL (ref 0–149)

## 2022-09-16 ENCOUNTER — Other Ambulatory Visit: Payer: Self-pay | Admitting: Internal Medicine

## 2022-09-16 DIAGNOSIS — E785 Hyperlipidemia, unspecified: Secondary | ICD-10-CM

## 2022-11-11 LAB — NMR, LIPOPROFILE
Cholesterol, Total: 157 mg/dL (ref 100–199)
HDL Particle Number: 31.1 umol/L (ref >=30.5)
HDL-C: 67 mg/dL (ref >39)
LDL Particle Number: 964 nmol/L (ref <1000)
LDL Size: 21.1 nm (ref >20.5)
LDL-C (NIH Calc): 70 mg/dL (ref 0–99)
LP-IR Score: <25 (ref <=45)
Small LDL Particle Number: 431 nmol/L (ref <=527)
Triglycerides: 110 mg/dL (ref 0–149)

## 2022-11-14 ENCOUNTER — Ambulatory Visit (HOSPITAL_BASED_OUTPATIENT_CLINIC_OR_DEPARTMENT_OTHER): Payer: Medicare Other | Admitting: Internal Medicine

## 2022-11-14 ENCOUNTER — Encounter (HOSPITAL_BASED_OUTPATIENT_CLINIC_OR_DEPARTMENT_OTHER): Payer: Self-pay | Admitting: Internal Medicine

## 2022-11-14 VITALS — BP 126/86 | HR 84 | Ht 65.0 in | Wt 151.5 lb

## 2022-11-14 DIAGNOSIS — E785 Hyperlipidemia, unspecified: Secondary | ICD-10-CM | POA: Diagnosis not present

## 2022-11-14 DIAGNOSIS — N184 Chronic kidney disease, stage 4 (severe): Secondary | ICD-10-CM | POA: Diagnosis not present

## 2022-11-14 DIAGNOSIS — I700 Atherosclerosis of aorta: Secondary | ICD-10-CM | POA: Diagnosis not present

## 2022-11-14 DIAGNOSIS — E7841 Elevated Lipoprotein(a): Secondary | ICD-10-CM | POA: Diagnosis not present

## 2022-11-14 NOTE — Progress Notes (Signed)
LIPID CLINIC CONSULT NOTE  Chief Complaint:  Follow-up dyslipidemia  Primary Care Physician: Alicia Amel, MD  Primary Cardiologist:  Orbie Pyo, MD  HPI:  [name] is a 67 y.o. female who is being seen today for the evaluation of dyslipidemia at the request of Alicia Amel, MD. this is a pleasant 67 year old female kindly referred by Dr. Lynnette Caffey for evaluation and management of dyslipidemia.  He had recently seen her in the office for palpitations and noted she has some aortic atherosclerosis.  She was started on aspirin and it was recommended she continue on atorvastatin 40 mg daily.  An LP(a) was recommended -this resulted abnormal at 109.5 nmol/L, however her LP(a) was also assessed 8 years ago and was 34 mg/dL, slightly elevated (measured in mass versus particle number), so difficult to compare these values.  She was also complaining of some chest pain and a PET scan was ordered.  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  According to her nephrologist note, she had been intolerant of atorvastatin and developed cramping which seem to have improved after stopping the medicine which they had advised.  She has now been off of statins for several months.  She had repeat lipids on January 24, 2022, showing total cholesterol 248, HDL 73, triglycerides 110 and LDL 156.  Other than aortic atherosclerosis she has no known coronary or cerebrovascular disease.  She had only previously tried atorvastatin but has not tried other statins.  She has no history of liver enzyme abnormalities on statins.  05/14/2022  [name] returns today for follow-up.  She just had repeat lipid testing yesterday unfortunately the NMR results have not come back.  She did have a reduction in her LP(a) however to 87.7 from 109.5.  This is not likely related to the statin and may be due to other factors including her  chronic kidney disease.  Although, I suspect her cholesterol is lower.  She seems to be tolerating the medicine well without any side effects and says she feels very good.  11/14/2022  [name] is seen today in follow-up.  She continues to have good control of her lipids.  Surprisingly her particle numbers have gone up despite the fact that her LDL-C has gone down.  Total particle number is down to 964, LDL-C is 70, HDL-C 67, triglycerides were 110, and small LDL particle number now follow-up 431 (previously this was less than 90 nmol/L).  She reports no change in her diet or lifestyle.  Her kidney disease however has worsened somewhat.  She does have stage IV CKD.  PMHx:  Past Medical History:  Diagnosis Date   Allergy    Anemia    Annual physical exam 12/21/2018   Arthritis    Blood transfusion without reported diagnosis    Complication of anesthesia    Dysrhythmia    PALPITATIONS WITH CAFFEINE/ [place]   Edema 12/22/2012   GERD (gastroesophageal reflux disease)    Healthcare maintenance 03/17/2012   Heart murmur    Hematuria 01/08/2012   Microscopic  01/06/12    Hip pain, chronic, right 12/05/2009   Qualifier: Diagnosis of  Problem Stop Reason: Removed By: Quincy Carnes     History of hiatal hernia    Hypercalcemia 02/14/2011   Hyperlipidemia    past hx    Hypertension    MIGRAINE, UNSPEC., W/O INTRACTABLE MIGRAINE 06/18/2006   Qualifier:  Diagnosis of  By: Seleta Rhymes MD, Mark     Multiple thyroid nodules 04/15/2012   Need for immunization against influenza 04/12/2018   OSTEOPENIA 06/18/2006   Qualifier: Diagnosis of  By: Seleta Rhymes MD, Mark     Osteoporosis    osteopenia   PALPITATIONS, RECURRENT 07/16/2007   PONV (postoperative nausea and vomiting)    Renal cyst 01/08/2012   RENAL INSUFFICIENCY, CHRONIC 06/18/2006   Qualifier: Diagnosis of  By: Seleta Rhymes MD, Mark     RENAL INSUFFICIENCY, CHRONIC 06/18/2006        Right hip pain 04/12/2018   Skin nodule 04/12/2018   Thyroid nodule      Past Surgical History:  Procedure Laterality Date   APPENDECTOMY     COLONOSCOPY  03/17/2018   Dr.Perry   FINGER FRACTURE SURGERY     LAPAROSCOPIC ENDOMETRIOSIS FULGURATION     LASER TO REMOVE BLOOD   NOSE SURGERY     BROKEN   SHOULDER ARTHROSCOPY WITH SUBACROMIAL DECOMPRESSION, ROTATOR CUFF REPAIR AND BICEP TENDON REPAIR Right 11/02/2014   Procedure: RIGHT SHOULDER ARTHROSCOPY WITH SUBACROMIAL DECOMPRESSION, DISTAL CLAVICLE RESECTION, ROTATOR CUFF REPAIR ;  Surgeon: Francena Hanly, MD;  Location: [facility];  Service: Orthopedics;  Laterality: Right;    FAMHx:  Family History  Problem Relation Age of Onset   Kidney disease Mother    Kidney disease Maternal Uncle    Kidney disease Cousin    Esophageal cancer Maternal Uncle    Colon cancer Neg Hx    Colon polyps Neg Hx    Rectal cancer Neg Hx    Stomach cancer Neg Hx     SOCHx:   reports that she has never smoked. She has never used smokeless tobacco. She reports that she does not drink alcohol and does not use drugs.  ALLERGIES:  Allergies  Allergen Reactions   Sulfa Antibiotics Rash   Sulfamethoxazole Rash    REACTION: unspecified    ROS: Pertinent items noted in HPI and remainder of comprehensive ROS otherwise negative.  HOME MEDS: Current Outpatient Medications on File Prior to Visit  Medication Sig Dispense Refill   oxyCODONE-acetaminophen (PERCOCET) 7.5-325 MG tablet take 1 tablet by oral route  every 8 hours as needed (DNF 11/23/20) (Patient not taking: Reported on 01/28/2022)     pantoprazole (PROTONIX) 20 MG tablet TAKE 1 TABLET(20 MG) BY MOUTH DAILY (Patient not taking: Reported on 01/28/2022) 90 tablet 1   rosuvastatin (CRESTOR) 5 MG tablet Take 1 tablet (5 mg total) by mouth daily. 30 tablet 11   Current Facility-Administered Medications on File Prior to Visit  Medication Dose Route Frequency Provider Last Rate Last Admin   0.9 %  sodium chloride infusion  500 mL Intravenous Once Hilarie Fredrickson, MD         LABS/IMAGING: No results found for this or any previous visit (from the past 48 hour(s)).  No results found.  LIPID PANEL:    Component Value Date/Time   CHOL 248 (H) 01/24/2022 0842   CHOL 246 (H) 06/24/2013 1100   TRIG 110 01/24/2022 0842   TRIG 160 (H) 06/24/2013 1100   HDL 73 01/24/2022 0842   HDL 62 06/24/2013 1100   CHOLHDL 3.4 01/24/2022 0842   CHOLHDL 4.3 01/18/2016 1158   VLDL 31 (H) 01/18/2016 1158   LDLCALC 156 (H) 01/24/2022 0842   LDLCALC 152 (H) 06/24/2013 1100   LDLDIRECT 124 (H) 10/21/2006 2036    WEIGHTS: Wt Readings from Last 3 Encounters:  05/14/22 157 lb (71.2 kg)  01/28/22 153 lb 6.4 oz (69.6 kg)  11/07/21 151 lb 12.8 oz (68.9 kg)    VITALS: There were no vitals taken for this visit.  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  PLAN: 1.   [name] continues to have well treated dyslipidemia on low-dose rosuvastatin however her particle numbers have gone up despite the fact that her cholesterol has gone down.  She still remains adequately treated so I will not further adjust her statin medication.  She reports some mild cramping on this but much better than other treatments in the past.  Will plan repeat lipids in 6 months and follow-up with me afterwards.  Chrystie Nose, MD, [facility], FACP  [place]  [facility]  Medical Director of the Advanced Lipid Disorders &  Cardiovascular Risk Reduction Clinic Diplomate of the American Board of Clinical Lipidology Attending Cardiologist  Direct Dial: [phone]  Fax: [phone]  Website:  [email or web address] Ha [email or web address] 11/14/2022, 11:18 AM

## 2022-11-14 NOTE — Patient Instructions (Signed)
Medication Instructions:  Your physician recommends that you continue on your current medications as directed. Please refer to the Current Medication list given to you today.  *If you need a refill on your cardiac medications before your next appointment, please call your pharmacy*   Lab Work: Your physician recommends that you return for lab work one week before next appointment- nmr If you have labs (blood work) drawn today and your tests are completely normal, you will receive your results only by: MyChart Message (if you have MyChart) OR A paper copy in the mail If you have any lab test that is abnormal or we need to change your treatment, we will call you to review the results.   Follow-Up: At [facility], you and your health needs are our priority.  As part of our continuing mission to provide you with exceptional heart care, we have created designated Provider Care Teams.  These Care Teams include your primary Cardiologist (physician) and Advanced Practice Providers (APPs -  Physician Assistants and Nurse Practitioners) who all work together to provide you with the care you need, when you need it.  We recommend signing up for the patient portal called "MyChart".  Sign up information is provided on this After Visit Summary.  MyChart is used to connect with patients for Virtual Visits (Telemedicine).  Patients are able to view lab/test results, encounter notes, upcoming appointments, etc.  Non-urgent messages can be sent to your provider as well.   To learn more about what you can do with MyChart, go to [email or web address].    Your next appointment:   6 months with Dr. Rennis Golden in Lipid Clinic

## 2022-12-18 ENCOUNTER — Other Ambulatory Visit: Payer: Self-pay | Admitting: Adult Health

## 2022-12-18 DIAGNOSIS — N644 Mastodynia: Secondary | ICD-10-CM

## 2022-12-18 DIAGNOSIS — Z1231 Encounter for screening mammogram for malignant neoplasm of breast: Secondary | ICD-10-CM

## 2022-12-23 ENCOUNTER — Other Ambulatory Visit: Payer: Medicare Other

## 2022-12-30 ENCOUNTER — Other Ambulatory Visit: Payer: Medicare Other

## 2023-01-01 ENCOUNTER — Encounter: Payer: Self-pay | Admitting: Skilled Nursing Facility1

## 2023-01-01 ENCOUNTER — Encounter: Payer: Medicare Other | Attending: Nephrology | Admitting: Skilled Nursing Facility1

## 2023-01-01 DIAGNOSIS — M109 Gout, unspecified: Secondary | ICD-10-CM | POA: Diagnosis not present

## 2023-01-01 DIAGNOSIS — N1832 Chronic kidney disease, stage 3b: Secondary | ICD-10-CM | POA: Diagnosis present

## 2023-01-01 NOTE — Progress Notes (Addendum)
  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  Pt states she has an appt upcoming to discuss a kidney transplant.  Pt states tomatoes, onions, cheese, yogurt broccoli, cereal, pinto beans, watermelon, raisins make her hands an toes cramp: after drinking tea in 3 minutes she starts to cramp stating she will have pickle juice or mustard to stop the cramping.   Estimated daily fluid intake: 150+ oz Supplements:  Sleep:  Stress / self-care:  Current average weekly physical activity:   24-Hr Dietary Recall First Meal: egg, sausage, coffee + cream + honey Snack: oatmeal cram pie or raisin cream pie Second Meal: tossed salad: tomato, ranch, cucumber, chicken   Snack:   Third Meal: tossed salad: tomato, ranch, cucumber, chicken  and green beans  Snack:  Beverages: 150+ oz water   NUTRITION INTERVENTION  Nutrition education (E-1) on the following topics:  Electrolyte balance  Fluid balance and kidney function  NA, K, glucose explained  Handouts Provided Include  Renal MyPlate  Learning Style & Readiness for Change Teaching method utilized: Visual & Auditory  Demonstrated degree of understanding via: Teach Back  Barriers to learning/adherence to lifestyle change: unidentified   Goals Established by Pt Drop your fluid to 80 ounces per day (perhaps this will help with your cramping)  Make your meals from home and avoid processed foods Eat rice and other items circled on your handout to eat enough calories each meal and each day  Try buttered noodles with [place] or ground chicken with buttered rice    MONITORING &  EVALUATION Dietary intake, weekly physical activity  Next Steps  Patient is to call or email with any future questions or concerns.

## 2023-01-02 ENCOUNTER — Other Ambulatory Visit: Payer: Self-pay

## 2023-01-02 ENCOUNTER — Other Ambulatory Visit: Payer: Self-pay | Admitting: Internal Medicine

## 2023-01-02 DIAGNOSIS — N644 Mastodynia: Secondary | ICD-10-CM

## 2023-01-08 ENCOUNTER — Ambulatory Visit
Admission: RE | Admit: 2023-01-08 | Discharge: 2023-01-08 | Disposition: A | Discharge status: Home / Self Care | Payer: Medicare Other | Source: Ambulatory Visit | Attending: Adult Health | Admitting: Adult Health

## 2023-01-08 ENCOUNTER — Ambulatory Visit: Payer: Medicare Other

## 2023-01-08 DIAGNOSIS — N644 Mastodynia: Secondary | ICD-10-CM

## 2023-07-28 LAB — HM DEXA SCAN

## 2023-08-17 ENCOUNTER — Encounter: Payer: Self-pay | Admitting: Family Medicine

## 2023-08-17 ENCOUNTER — Ambulatory Visit (INDEPENDENT_AMBULATORY_CARE_PROVIDER_SITE_OTHER): Admitting: Family Medicine

## 2023-08-17 VITALS — BP 112/82 | HR 80 | Ht 63.0 in | Wt 163.0 lb

## 2023-08-17 DIAGNOSIS — N184 Chronic kidney disease, stage 4 (severe): Secondary | ICD-10-CM

## 2023-08-17 DIAGNOSIS — M810 Age-related osteoporosis without current pathological fracture: Secondary | ICD-10-CM | POA: Diagnosis not present

## 2023-08-17 NOTE — Patient Instructions (Addendum)
 Thank you for coming in today.   Continue Vitamin D   I am going talk to your nephrology  Let me know if you get a kidney transplant

## 2023-08-17 NOTE — Progress Notes (Signed)
   Joanna Muck, PhD, LAT, ATC acting as a scribe for Garlan Juniper, MD.  [name] is a 68 y.o. female who presents to [facility] today for osteoporosis management. Family hx of osteoporosis; mom  DEXA scan (date, T-score): 07/28/23: Spine= -2.7, L-FN= -2.0, R-FN= -2.4 Prior treatment: no History of Hip, Spine, or Wrist Fx: none, L foot avulsion fx 2020 Heart disease or stroke: no Cancer: no Kidney Disease: yes; stage 4 Gastric/Peptic Ulcer: no Gastric bypass surgery: no Severe GERD: yes Hx of seizures: no Age at Menopause: 30 Calcium  intake: no Vitamin D  intake: yes- 5000 Hormone replacement therapy: no Smoking history: never smoked Alcohol: none Exercise: yes- walking daily Major dental work in past year: no Parents with hip/spine fracture: yes- mom, hip fx   Height loss: yes: 65" to 63"   Pertinent review of systems: No fevers or chills  Relevant historical information: IgA nephropathy with chronic kidney disease.  She is eligible for a kidney transplant but is radiating a little bit.  She is manage to [facility] with Dr. Christianne Cowper.  Exam:  BP 112/82   Pulse 80   Ht 5\' 3"  (1.6 m)   Wt 163 lb (73.9 kg)   SpO2 98%   BMI 28.87 kg/m  General: Well Developed, well nourished, and in no acute distress.   MSK: Normal lumbar motion    Lab and Radiology Results  DEXA scan dated July 28, 2023 showing T-score -2.7 AP spine.  [facility]    Assessment and Plan: 68 y.o. female with osteoporosis in the setting of chronic kidney disease stage 4 with GFR around 46. She is doing a pretty good job with conservative management with walking and weightbearing exercises.  I recommend that she hire a personal trainer to further direct her home exercise program which I think could be quite helpful.  She is already taking vitamin D  by her nephrologist and is not taking calcium  again recommended by her  nephrologist.  Unfortunately the medicines we typically would use for osteoporosis are very challenging or outright just not safe with GFR less than 30.  I will send a letter to her nephrologist talking about osteoporosis management options.  Ultimately I think we can wait on medicines until she has a kidney transplant and then medicines may be more feasible.   PDMP not reviewed this encounter. No orders of the defined types were placed in this encounter.  No orders of the defined types were placed in this encounter.    Discussed warning signs or symptoms. Please see discharge instructions. Patient expresses understanding.   The above documentation has been reviewed and is accurate and complete Garlan Juniper, M.D.

## 2023-08-28 ENCOUNTER — Encounter (HOSPITAL_COMMUNITY): Payer: Self-pay

## 2023-08-28 ENCOUNTER — Ambulatory Visit (HOSPITAL_COMMUNITY)
Admission: EM | Admit: 2023-08-28 | Discharge: 2023-08-28 | Disposition: A | Discharge status: Home / Self Care | Attending: Nurse Practitioner | Admitting: Nurse Practitioner

## 2023-08-28 DIAGNOSIS — M25512 Pain in left shoulder: Secondary | ICD-10-CM

## 2023-08-28 DIAGNOSIS — M25561 Pain in right knee: Secondary | ICD-10-CM | POA: Diagnosis not present

## 2023-08-28 DIAGNOSIS — R030 Elevated blood-pressure reading, without diagnosis of hypertension: Secondary | ICD-10-CM

## 2023-08-28 DIAGNOSIS — M7918 Myalgia, other site: Secondary | ICD-10-CM

## 2023-08-28 DIAGNOSIS — S50812A Abrasion of left forearm, initial encounter: Secondary | ICD-10-CM

## 2023-08-28 DIAGNOSIS — S50811A Abrasion of right forearm, initial encounter: Secondary | ICD-10-CM

## 2023-08-28 MED ORDER — METHOCARBAMOL 500 MG PO TABS: 500.0000 mg | ORAL_TABLET | Freq: Every morning | ORAL | 0 refills | Status: DC

## 2023-08-28 MED ORDER — CYCLOBENZAPRINE HCL 5 MG PO TABS: 5.0000 mg | ORAL_TABLET | Freq: Every day | ORAL | 0 refills | Status: DC
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
## 2023-09-10 ENCOUNTER — Ambulatory Visit (HOSPITAL_COMMUNITY)
Admission: EM | Admit: 2023-09-10 | Discharge: 2023-09-10 | Disposition: A | Discharge status: Home / Self Care | Attending: Physician Assistant | Admitting: Physician Assistant

## 2023-09-10 ENCOUNTER — Encounter (HOSPITAL_COMMUNITY): Payer: Self-pay

## 2023-09-10 ENCOUNTER — Ambulatory Visit (INDEPENDENT_AMBULATORY_CARE_PROVIDER_SITE_OTHER)

## 2023-09-10 DIAGNOSIS — M25561 Pain in right knee: Secondary | ICD-10-CM

## 2023-09-10 DIAGNOSIS — M545 Low back pain, unspecified: Secondary | ICD-10-CM

## 2023-09-10 DIAGNOSIS — I10 Essential (primary) hypertension: Secondary | ICD-10-CM

## 2023-09-10 DIAGNOSIS — M25512 Pain in left shoulder: Secondary | ICD-10-CM

## 2023-09-10 MED ORDER — BACLOFEN 10 MG PO TABS: 10.0000 mg | ORAL_TABLET | Freq: Two times a day (BID) | ORAL | 0 refills | Status: DC | PRN

## 2023-09-10 MED ORDER — LIDOCAINE 5 % EX PTCH: 1.0000 | MEDICATED_PATCH | CUTANEOUS | 0 refills | Status: AC
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
## 2023-09-10 NOTE — Discharge Instructions (Addendum)
 Your x-ray did not show any evidence of a broken bone in your shoulder, knee, back.  They did see some arthritis in your back.  I would like you to continue with the brace and keep your knee elevated.  Take your prescribed oxycodone  for pain relief.  I have also called in a muscle relaxer but this can be sedating so do not drive or drink alcohol with taking it.  Apply lidocaine  patch during the day and then remove it at night.  I would like you to follow-up with orthopedics as soon as possible.  Call them to schedule an appointment.  If anything worsens please return for reevaluation.  Your blood pressure is elevated.  If you develop any chest pain, shortness of breath, headache, vision change, dizziness in the setting of high blood pressure you need to be seen immediately.  Monitor this regularly at home and follow-up with your primary care in a few weeks to have this rechecked.

## 2023-12-17 ENCOUNTER — Other Ambulatory Visit (HOSPITAL_BASED_OUTPATIENT_CLINIC_OR_DEPARTMENT_OTHER): Payer: Self-pay | Admitting: Internal Medicine

## 2024-01-07 ENCOUNTER — Ambulatory Visit (HOSPITAL_COMMUNITY)
Admission: RE | Admit: 2024-01-07 | Discharge: 2024-01-07 | Disposition: A | Discharge status: Home / Self Care | Source: Ambulatory Visit | Attending: Vascular Surgery | Admitting: Vascular Surgery

## 2024-01-07 ENCOUNTER — Other Ambulatory Visit (HOSPITAL_COMMUNITY): Payer: Self-pay | Admitting: Internal Medicine

## 2024-01-07 DIAGNOSIS — M79605 Pain in left leg: Secondary | ICD-10-CM

## 2024-01-12 ENCOUNTER — Other Ambulatory Visit (HOSPITAL_BASED_OUTPATIENT_CLINIC_OR_DEPARTMENT_OTHER): Payer: Self-pay | Admitting: Internal Medicine

## 2024-01-13 ENCOUNTER — Ambulatory Visit: Admitting: Family Medicine

## 2024-01-13 ENCOUNTER — Other Ambulatory Visit: Payer: Self-pay

## 2024-01-13 VITALS — BP 161/70 | Ht 65.0 in | Wt 160.0 lb

## 2024-01-13 DIAGNOSIS — M25562 Pain in left knee: Secondary | ICD-10-CM

## 2024-01-13 MED ORDER — TIZANIDINE HCL 4 MG PO TABS: 4.0000 mg | ORAL_TABLET | Freq: Three times a day (TID) | ORAL | 0 refills | Status: AC | PRN

## 2024-01-13 NOTE — Patient Instructions (Addendum)
 Your x-rays, ultrasound, and doppler were reassuring. This is consistent with peroneal muscle strain/spasms. Heat 15 minutes at a time 3-4 times a day. Compression sleeve during the day will help with the muscles as well. Topical biofreeze or capsaicin may help with the pain. Tizanidine  as needed for severe spasms (can make you sleepy though - if you're taking baclofen  do not also take this on the same day). Do home exercises every other day at least. Consider physical therapy if not improving. Follow up with me in 1 month.

## 2024-01-14 ENCOUNTER — Encounter: Payer: Self-pay | Admitting: Family Medicine

## 2024-01-14 NOTE — Progress Notes (Signed)
 PCP: Stephane Leita DEL, MD  Subjective:   HPI: Patient is a 68 y.o. female here for left lower leg pain.  Patient reports she's had pain in left lower leg since the end of June. Noticed a dark/bruised spot without injury at that time that was present for 4 days. In August her pain worsened. Associated spasms in same area lateral lower leg. She's had radiographs and a doppler ultrasound that were negative. Here for further evaluation. Tried oxycodone .  Past Medical History:  Diagnosis Date   Allergy    Anemia    Annual physical exam 12/21/2018   Arthritis    Blood transfusion without reported diagnosis    Complication of anesthesia    Dysrhythmia    PALPITATIONS WITH CAFFEINE/ CHOC   Edema 12/22/2012   GERD (gastroesophageal reflux disease)    Healthcare maintenance 03/17/2012   Heart murmur    Hematuria 01/08/2012   Microscopic  01/06/12    Hip pain, chronic, right 12/05/2009   Qualifier: Diagnosis of  Problem Stop Reason: Removed By: Prentiss ROSALEA Frieze     History of hiatal hernia    Hypercalcemia 02/14/2011   Hyperlipidemia    past hx    Hypertension    MIGRAINE, UNSPEC., W/O INTRACTABLE MIGRAINE 06/18/2006   Qualifier: Diagnosis of  By: Xavier MD, Mark     Multiple thyroid  nodules 04/15/2012   Need for immunization against influenza 04/12/2018   OSTEOPENIA 06/18/2006   Qualifier: Diagnosis of  By: Xavier MD, Mark     Osteoporosis    osteopenia   PALPITATIONS, RECURRENT 07/16/2007   PONV (postoperative nausea and vomiting)    Renal cyst 01/08/2012   RENAL INSUFFICIENCY, CHRONIC 06/18/2006   Qualifier: Diagnosis of  By: Xavier MD, Mark     RENAL INSUFFICIENCY, CHRONIC 06/18/2006        Right hip pain 04/12/2018   Skin nodule 04/12/2018   Thyroid  nodule     Current Outpatient Medications on File Prior to Visit  Medication Sig Dispense Refill   lidocaine  (LIDODERM ) 5 % Place 1 patch onto the skin daily. Remove & Discard patch within 12 hours or as directed by MD 30 patch 0    oxyCODONE -acetaminophen  (PERCOCET) 7.5-325 MG tablet      pantoprazole  (PROTONIX ) 20 MG tablet TAKE 1 TABLET(20 MG) BY MOUTH DAILY 90 tablet 1   rosuvastatin  (CRESTOR ) 5 MG tablet TAKE 1 TABLET(5 MG) BY MOUTH DAILY 15 tablet 0   Vitamin D , Ergocalciferol , (DRISDOL) 1.25 MG (50000 UNIT) CAPS capsule Take 50,000 Units by mouth once a week.     Current Facility-Administered Medications on File Prior to Visit  Medication Dose Route Frequency Provider Last Rate Last Admin   0.9 %  sodium chloride  infusion  500 mL Intravenous Once Abran Norleen SAILOR, MD        Past Surgical History:  Procedure Laterality Date   APPENDECTOMY     COLONOSCOPY  03/17/2018   Dr.Perry   FINGER FRACTURE SURGERY     LAPAROSCOPIC ENDOMETRIOSIS FULGURATION     LASER TO REMOVE BLOOD   NOSE SURGERY     BROKEN   SHOULDER ARTHROSCOPY WITH SUBACROMIAL DECOMPRESSION, ROTATOR CUFF REPAIR AND BICEP TENDON REPAIR Right 11/02/2014   Procedure: RIGHT SHOULDER ARTHROSCOPY WITH SUBACROMIAL DECOMPRESSION, DISTAL CLAVICLE RESECTION, ROTATOR CUFF REPAIR ;  Surgeon: Franky Pointer, MD;  Location: [facility] OR;  Service: Orthopedics;  Laterality: Right;    Allergies  Allergen Reactions   Sulfa Antibiotics Rash   Sulfamethoxazole Rash    REACTION: unspecified  BP (!) 161/70   Ht 5' 5 (1.651 m)   Wt 160 lb (72.6 kg)   BMI 26.63 kg/m       No data to display              No data to display              Objective:  Physical Exam:  Gen: NAD, comfortable in exam room  Left lower leg: No gross deformity, swelling, ecchymoses Full range of motion ankle and knee without pain.  Normal strength including resisted ankle motions. Mild tenderness to palpation over peroneal musculature, midportion of fibula Negative ant drawer and negative talar tilt.   NV intact distally.  Limited MSK u/s left lower leg:  No cortical irregularity or edema overlying cortex of fibula.  No masses or abnormalities of lower leg in area of  discomfort.  No peroneal muscle tears.  Assessment & Plan:  1. Left lower leg pain - patient's radiographs, doppler, and musculoskeletal ultrasound are all reassuring.  This is consistent with peroneal muscle strain/spasms.  Compression sleeve, heat, topical medications.  Rare tizanidine  if needed (if not taking baclofen ).  Home exercises reviewed.  Consider formal physical therapy.  Follow up in 1 month.

## 2024-01-30 ENCOUNTER — Other Ambulatory Visit (HOSPITAL_BASED_OUTPATIENT_CLINIC_OR_DEPARTMENT_OTHER): Payer: Self-pay | Admitting: Internal Medicine

## 2024-02-02 ENCOUNTER — Telehealth: Payer: Self-pay | Admitting: Internal Medicine

## 2024-02-02 ENCOUNTER — Other Ambulatory Visit: Payer: Self-pay | Admitting: Internal Medicine

## 2024-02-02 MED ORDER — ROSUVASTATIN CALCIUM 5 MG PO TABS: 5.0000 mg | ORAL_TABLET | Freq: Every day | ORAL | 0 refills | Status: DC

## 2024-02-02 NOTE — Telephone Encounter (Signed)
*  STAT* If patient is at the pharmacy, call can be transferred to refill team.   1. Which medications need to be refilled? (please list name of each medication and dose if known)   rosuvastatin  (CRESTOR ) 5 MG tablet     4. Which pharmacy/location (including street and city if local pharmacy) is medication to be sent to?  [id number] - [place], [place] - [address]     5. Do they need a 30 day or 90 day supply? 90   Scheduled 10/29

## 2024-02-02 NOTE — Telephone Encounter (Signed)
 Pt's medication was sent to pt's pharmacy as requested. Confirmation received.

## 2024-02-09 ENCOUNTER — Other Ambulatory Visit: Payer: Self-pay | Admitting: *Deleted

## 2024-02-09 ENCOUNTER — Encounter: Payer: Self-pay | Admitting: *Deleted

## 2024-02-09 DIAGNOSIS — E785 Hyperlipidemia, unspecified: Secondary | ICD-10-CM

## 2024-02-09 DIAGNOSIS — I700 Atherosclerosis of aorta: Secondary | ICD-10-CM

## 2024-02-09 DIAGNOSIS — E7841 Elevated Lipoprotein(a): Secondary | ICD-10-CM

## 2024-02-13 NOTE — Progress Notes (Unsigned)
 Cardiology Office Note   Date:  02/17/2024  ID:  [name], DOB [birth year], MRN [id number] PCP: Stephane Leita DEL, MD  [facility] Providers Cardiologist:  Lurena MARLA Red, MD     [facility] Dyslipidemia Aortic atherosclerosis CKD Stage IV Elevated LP(a)  Referred to Advanced Lipid disorders clinic and seen by Dr. Mona 01/28/22. Initially seen by DrRONITA Moose in 2015 for DOE. 48-hour Holter monitor results were unremarkable.  GXT was low risk.  Seen by Dr. Red 11/07/2021 for palpitations, she was also known to have aortic atherosclerosis.  TTE 11/20/2021 revealed normal LVEF 65 to 70%, normal diastolic function, aortic valve sclerosis with no evidence of stenosis, trivial MR.  She was referred to Dr. Mona for elevated LP(a) and dyslipidemia with LDL of 83 July 2023 and 156 October 2023.  Cardiac monitor revealed predominant sinus rhythm with average HR 72 bpm, no atrial fibrillation, ventricular tachyarrhythmias, or bradycardia arrhythmias, patient triggered events with sinus rhythm, PACs and PVCs.  Cardiac PET/CT revealed no evidence of ischemia or infarction, minimal coronary calcification, normal global myocardial blood flow.  Seen by Dr. Mona 01/28/2022 at which time she was taking atorvastatin  40 mg daily.  LP(a) was abnormal at 109.5 nanomoles per liter, however LP(a) was assessed 8 years prior and was 34 mg/dL, slightly elevated (measured in Mast versus particle number), so difficult to compare these values.  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  According to nephrologist note, she was intolerant of atorvastatin  with cramping which improved after the medication was discontinued.  She had been off statins for several months.  Lipid panel 01/24/2022 showed total cholesterol 248, HDL 73, triglycerides 110, and LDL 156.  No history of liver enzyme abnormalities on statins.  She had not tried other statin medications.  She was  advised to start rosuvastatin  5 mg daily.  On repeat testing 05/14/2022, there was improvement in LP(a) from 109.5-87.7.  Dr. Mona felt this was secondary to CKD, not statin therapy.  NMR revealed LDL particle #797, LDL-C 98, HDL-C 84, triglycerides 68, total cholesterol 194, and small LDL-P < 90.  At follow-up visit 11/14/2022, she had increase in particle numbers despite reduction in LDL.  She reported mild cramping but no significant discomfort on rosuvastatin .  Lipid panel completed 04/08/2023 with total cholesterol 190, triglycerides 92, HDL 72, LDL-C 99. NMR 02/15/24: LDL particle number [id number], LDL-C 97, HDL-C 79, triglyceride 106, total cholesterol 195, small LDL-P 145.   History of Present Illness  Discussed the use of AI scribe software for clinical note transcription with the patient, who gave verbal consent to proceed.  History of Present Illness [name] is a very pleasant 68 year old female who is here today for follow-up of hyperlipidemia. She presents with palpitations that occur under her left breast and are most notable when she is laying down.  They do not worsen with exertion and are similar to past episodes. We reviewed lipid results along with results from cardiac PET CT which revealed coronary calcification, but no evidence of ischemia or infarction. She takes rosuvastatin  5 mg daily, though she occasionally misses doses. Higher doses previously caused cramping, which resolved with the current dose. Her LDL is stable at 98, improved from 156 in 2023. Her blood pressure fluctuates, sometimes causing tunnel vision and dizziness, and at other times leading to headaches. She is on amlodipine  2.5 mg for blood pressure management. No chest pain, shortness of breath, or  lightheadedness.  ROS: See HPI  Studies Reviewed EKG Interpretation Date/Time:  Wednesday February 17 2024 09:25:28 EDT Ventricular Rate:  68 PR Interval:  150 QRS Duration:  74 QT Interval:  386 QTC  Calculation: 410 R Axis:   17  Text Interpretation: Normal sinus rhythm Normal ECG When compared with ECG of 16-Aug-2020 11:12, No significant change was found Confirmed by Percy Browning [phone]) on 02/17/2024 9:31:49 AM     Lipoprotein (a)  Date/Time Value Ref Range Status  05/13/2022 11:05 AM 87.7 (H) <75.0 nmol/L Final    Comment:    Note:  Values greater than or equal to 75.0 nmol/L may        indicate an independent risk factor for CHD,        but must be evaluated with caution when applied        to non-Caucasian populations due to the        influence of genetic factors on Lp(a) across        ethnicities.     Risk Assessment/Calculations           Physical Exam VS:  BP 136/84 (BP Location: Left Arm, Patient Position: Sitting, Cuff Size: Normal)   Pulse 68   Ht 5' 5 (1.651 m)   Wt 155 lb 12.8 oz (70.7 kg)   SpO2 98%   BMI 25.93 kg/m    Wt Readings from Last 3 Encounters:  02/17/24 155 lb 12.8 oz (70.7 kg)  01/13/24 160 lb (72.6 kg)  08/17/23 163 lb (73.9 kg)    GEN: Well nourished, well developed in no acute distress NECK: No JVD; No carotid bruits CARDIAC: RRR, no murmurs, rubs, gallops RESPIRATORY:  Clear to auscultation without rales, wheezing or rhonchi  ABDOMEN: Soft, non-tender, non-distended EXTREMITIES:  No edema; No deformity   Assessment & Plan Hyperlipidemia LDL goal < 70 Coronary artery calcification NMR completed 02/15/2024 with LDL particle #844, LDL-C 97, HDL-C 79, triglycerides 106, total cholesterol 195, small LDL-P 145.  LDL above goal of 70 or lower given history of coronary calcification noted on cardiac PET CT 01/2022. She denies chest pain, dyspnea, or other symptoms concerning for angina.  No indication for further ischemic evaluation at this time. We had lengthy discussion about potential uptitration of lipid-lowering therapies, including ezetimibe, PCSK9 inhibitor, and bempedoic acid. She prefers to remain on current regimen of  rosuvastatin  5 mg daily given no adverse side effects.  She had muscle cramping in her hands and feet on higher dose statin.  Advised of risk of worsening ASCVD given lipids are not at goal. She verbalized understanding.  - Consider adding additional lipid lowering therapy if LDL remains elevated above goal - Monitor liver enzymes if ezetimibe is added or statin is increased - Recheck lipid panel in 6 months - Continue rosuvastatin  5 mg daily  Palpitations   Intermittent palpitations similar to previous episodes, and do not worsen with exertion. Cardiac  monitor 11/2021 revealed occasional PACs and PVCs, no atrial fibrillation, ventricular tachyarrhythmias, or bradycardia arrhythmias. She denies chest pain, presyncope or syncope. Despite no significant arrhythmias previously noted, she is concerned about palpitations which are most notable when lying down and would like to repeat cardiac monitor.  - Order a 14-day cardiac monitor - Consider beta blocker if symptoms persist  Hypertension   She notes recent BP fluctuations that have improved with addition of amlodipine  2.5 mg. Office BP is 130/78 mmHg, indicating good control.  - Continue amlodipine  2.5 mg daily  -  Continue home blood pressure monitoring regularly  Chronic Kidney Disease Stage IV   She has chronic kidney disease with creatinine at 3 mg/dL on labs completed 04/7972. She is on transplant list, with a hold requested for six months. No acute concerns today.  - Management per nephrology         Dispo: 6 months with Dr. Mona or me  Signed, Rosaline Bane, NP-C

## 2024-02-15 ENCOUNTER — Encounter (HOSPITAL_BASED_OUTPATIENT_CLINIC_OR_DEPARTMENT_OTHER): Payer: Self-pay

## 2024-02-16 LAB — NMR, LIPOPROFILE
Cholesterol, Total: 195 mg/dL (ref 100–199)
HDL Particle Number: 33.2 umol/L (ref >=30.5)
HDL-C: 79 mg/dL (ref >39)
LDL Particle Number: 844 nmol/L (ref <1000)
LDL Size: 21.4 nm (ref >20.5)
LDL-C (NIH Calc): 97 mg/dL (ref 0–99)
LP-IR Score: <25 (ref <=45)
Small LDL Particle Number: 145 nmol/L (ref <=527)
Triglycerides: 106 mg/dL (ref 0–149)

## 2024-02-17 ENCOUNTER — Ambulatory Visit (INDEPENDENT_AMBULATORY_CARE_PROVIDER_SITE_OTHER): Admitting: Nurse Practitioner

## 2024-02-17 ENCOUNTER — Encounter (HOSPITAL_BASED_OUTPATIENT_CLINIC_OR_DEPARTMENT_OTHER): Payer: Self-pay | Admitting: Nurse Practitioner

## 2024-02-17 ENCOUNTER — Other Ambulatory Visit (HOSPITAL_BASED_OUTPATIENT_CLINIC_OR_DEPARTMENT_OTHER)

## 2024-02-17 VITALS — BP 136/84 | HR 68 | Ht 65.0 in | Wt 155.8 lb

## 2024-02-17 DIAGNOSIS — I2510 Atherosclerotic heart disease of native coronary artery without angina pectoris: Secondary | ICD-10-CM

## 2024-02-17 DIAGNOSIS — N184 Chronic kidney disease, stage 4 (severe): Secondary | ICD-10-CM | POA: Diagnosis not present

## 2024-02-17 DIAGNOSIS — R002 Palpitations: Secondary | ICD-10-CM | POA: Diagnosis not present

## 2024-02-17 DIAGNOSIS — E785 Hyperlipidemia, unspecified: Secondary | ICD-10-CM

## 2024-02-17 DIAGNOSIS — E7841 Elevated Lipoprotein(a): Secondary | ICD-10-CM

## 2024-02-17 DIAGNOSIS — I10 Essential (primary) hypertension: Secondary | ICD-10-CM

## 2024-02-17 NOTE — Patient Instructions (Signed)
 Medication Instructions:  Your physician recommends that you continue on your current medications as directed. Please refer to the Current Medication list given to you today.   Labwork: FASTING LIPIDS PRIOR TO FOLLOW   Testing/Procedures: 14 DAY ZIO   Follow-Up: 6 MONTHS WITH MICHELLE S NP   Any Other Special Instructions Will Be Listed Below (If Applicable).   ZIO XT- Long Term Monitor Instructions  Your physician has requested you wear a ZIO patch monitor for 14 days.  This is a single patch monitor. [employer] supplies one patch monitor per enrollment. Additional stickers are not available. Please do not apply patch if you will be having a Nuclear Stress Test,  Echocardiogram, Cardiac CT, MRI, or Chest Xray during the period you would be wearing the  monitor. The patch cannot be worn during these tests. You cannot remove and re-apply the  ZIO XT patch monitor.  Your ZIO patch monitor will be mailed 3 day USPS to your address on file. It may take 3-5 days  to receive your monitor after you have been enrolled.  Once you have received your monitor, please review the enclosed instructions. Your monitor  has already been registered assigning a specific monitor serial # to you.  Billing and Patient Assistance Program Information  We have supplied [employer] with any of your insurance information on file for billing purposes. [employer] offers a sliding scale Patient Assistance Program for patients that do not have  insurance, or whose insurance does not completely cover the cost of the ZIO monitor.  You must apply for the Patient Assistance Program to qualify for this discounted rate.  To apply, please call [employer] at [phone], select option 4, select option 2, ask to apply for  Patient Assistance Program. Meredeth will ask your household income, and how many people  are in your household. They will quote your out-of-pocket cost based on that information.  [employer] will also be able to set  up a 51-month, interest-free payment plan if needed.  Applying the monitor   Shave hair from upper left chest.  Hold abrader disc by orange tab. Rub abrader in 40 strokes over the upper left chest as  indicated in your monitor instructions.  Clean area with 4 enclosed alcohol pads. Let dry.  Apply patch as indicated in monitor instructions. Patch will be placed under collarbone on left  side of chest with arrow pointing upward.  Rub patch adhesive wings for 2 minutes. Remove white label marked 1. Remove the white  label marked 2. Rub patch adhesive wings for 2 additional minutes.  While looking in a mirror, press and release button in center of patch. A small green light will  flash 3-4 times. This will be your only indicator that the monitor has been turned on.  Do not shower for the first 24 hours. You may shower after the first 24 hours.  Press the button if you feel a symptom. You will hear a small click. Record Date, Time and  Symptom in the Patient Logbook.  When you are ready to remove the patch, follow instructions on the last 2 pages of Patient  Logbook. Stick patch monitor onto the last page of Patient Logbook.  Place Patient Logbook in the blue and white box. Use locking tab on box and tape box closed  securely. The blue and white box has prepaid postage on it. Please place it in the mailbox as  soon as possible. Your physician should have your test results approximately 7 days  after the  monitor has been mailed back to [place].  Call [facility] Customer Care at [phone] if you have questions regarding  your ZIO XT patch monitor. Call them immediately if you see an orange light blinking on your  monitor.  If your monitor falls off in less than 4 days, contact our Monitor department at [phone].  If your monitor becomes loose or falls off after 4 days call [employer] at [phone] for  suggestions on securing your monitor

## 2024-02-23 ENCOUNTER — Ambulatory Visit: Payer: Self-pay | Admitting: Internal Medicine

## 2024-02-24 ENCOUNTER — Other Ambulatory Visit: Payer: Self-pay | Admitting: Family Medicine

## 2024-03-03 ENCOUNTER — Other Ambulatory Visit: Payer: Self-pay | Admitting: Internal Medicine

## 2024-03-05 DIAGNOSIS — R002 Palpitations: Secondary | ICD-10-CM

## 2024-03-07 ENCOUNTER — Ambulatory Visit (HOSPITAL_BASED_OUTPATIENT_CLINIC_OR_DEPARTMENT_OTHER): Payer: Self-pay | Admitting: Nurse Practitioner
